# Patient Record
Sex: Male | Born: 1985 | Race: White | Hispanic: Yes | Marital: Married | Smoking: Former smoker
Health system: Southern US, Community
[De-identification: ages and names within clinical notes are randomized; demographics above are authoritative.]

## PROBLEM LIST (undated history)

## (undated) DIAGNOSIS — K219 Gastro-esophageal reflux disease without esophagitis: Secondary | ICD-10-CM

## (undated) DIAGNOSIS — K5792 Diverticulitis of intestine, part unspecified, without perforation or abscess without bleeding: Secondary | ICD-10-CM

## (undated) HISTORY — PX: NO PAST SURGERIES: SHX2092

---

## 2005-11-25 ENCOUNTER — Emergency Department (HOSPITAL_COMMUNITY): Admission: EM | Admit: 2005-11-25 | Discharge: 2005-11-25 | Payer: Self-pay | Admitting: Emergency Medicine

## 2015-06-18 ENCOUNTER — Encounter (HOSPITAL_COMMUNITY): Payer: Self-pay

## 2015-06-18 ENCOUNTER — Emergency Department (HOSPITAL_COMMUNITY): Payer: Self-pay

## 2015-06-18 DIAGNOSIS — R079 Chest pain, unspecified: Secondary | ICD-10-CM | POA: Insufficient documentation

## 2015-06-18 LAB — BASIC METABOLIC PANEL
ANION GAP: 9 (ref 5–15)
BUN: 19 mg/dL (ref 6–20)
CO2: 28 mmol/L (ref 22–32)
Calcium: 9 mg/dL (ref 8.9–10.3)
Chloride: 100 mmol/L — ABNORMAL LOW (ref 101–111)
Creatinine, Ser: 0.76 mg/dL (ref 0.61–1.24)
GFR calc Af Amer: >60 mL/min (ref >60)
GLUCOSE: 120 mg/dL — AB (ref 65–99)
POTASSIUM: 3.7 mmol/L (ref 3.5–5.1)
SODIUM: 137 mmol/L (ref 135–145)

## 2015-06-18 LAB — CBC
HCT: 45 % (ref 39.0–52.0)
Hemoglobin: 14.8 g/dL (ref 13.0–17.0)
MCH: 29.9 pg (ref 26.0–34.0)
MCHC: 32.9 g/dL (ref 30.0–36.0)
MCV: 90.9 fL (ref 78.0–100.0)
PLATELETS: 250 10*3/uL (ref 150–400)
RBC: 4.95 MIL/uL (ref 4.22–5.81)
RDW: 13.2 % (ref 11.5–15.5)
WBC: 7.7 10*3/uL (ref 4.0–10.5)

## 2015-06-18 LAB — I-STAT TROPONIN, ED: TROPONIN I, POC: 0 ng/mL (ref 0.00–0.08)

## 2015-06-18 NOTE — ED Notes (Signed)
Pt reports onset 4 hours PTA right sided chest pain.  Pt was painting at onset.  Painful; when taking deep breaths.  NAD at triage.

## 2015-06-19 ENCOUNTER — Emergency Department (HOSPITAL_COMMUNITY)
Admission: EM | Admit: 2015-06-19 | Discharge: 2015-06-19 | Disposition: A | Discharge status: Home / Self Care | Payer: Self-pay | Attending: Emergency Medicine | Admitting: Emergency Medicine

## 2015-06-19 DIAGNOSIS — R079 Chest pain, unspecified: Secondary | ICD-10-CM

## 2015-06-19 LAB — D-DIMER, QUANTITATIVE (NOT AT ARMC)

## 2015-06-19 MED ORDER — IBUPROFEN 600 MG PO TABS
600.0000 mg | ORAL_TABLET | Freq: Three times a day (TID) | ORAL | Status: AC | PRN
Start: 2015-06-19 — End: ?

## 2015-06-19 NOTE — ED Notes (Signed)
Dr Pickering at bedside 

## 2015-06-19 NOTE — ED Notes (Signed)
Patient verbalized understanding of discharge instructions and denies any further needs or questions at this time. VS stable. Patient ambulatory with steady gait.  

## 2015-06-19 NOTE — ED Provider Notes (Signed)
CSN: [id number]     Arrival date & time 06/18/15  2144 History  By signing my name below, I, Phillis Haggis, attest that this documentation has been prepared under the direction and in the presence of Benjiman Core, MD. Electronically Signed: Phillis Haggis, ED Scribe. 06/19/2015. 2:10 AM.   Chief Complaint  Patient presents with  . Chest Pain   The history is provided by the patient. No language interpreter was used.  HPI Comments: Andre Wong is a 30 y.o. male who presents to the Emergency Department complaining of sudden onset, right sided chest pain onset 4 hours ago. He states that the pain began while he was working at his house. He reports worsening pain with deep breathing. He denies hx of similar symptoms, hx of smoking, recent long travel, fever, chills, leg swelling, cough, or SOB.   History reviewed. No pertinent past medical history. History reviewed. No pertinent past surgical history. History reviewed. No pertinent family history. Social History  Substance Use Topics  . Smoking status: Never Smoker   . Smokeless tobacco: None  . Alcohol Use: 7.2 oz/week    12 Cans of beer per week    Review of Systems  Cardiovascular: Positive for chest pain.  All other systems reviewed and are negative.  Allergies  Review of patient's allergies indicates no known allergies.  Home Medications   Prior to Admission medications   Medication Sig Start Date End Date Taking? Authorizing Provider  ibuprofen (ADVIL,MOTRIN) 600 MG tablet Take 1 tablet (600 mg total) by mouth every 8 (eight) hours as needed. 06/19/15   Benjiman Core, MD   BP 118/75 mmHg  Pulse 59  Temp(Src) 98 F (36.7 C) (Oral)  Resp 18  Wt 191 lb 9 oz (86.892 kg)  SpO2 99% Physical Exam  Constitutional: He is oriented to person, place, and time. He appears well-developed and well-nourished.  HENT:  Head: Normocephalic and atraumatic.  Eyes: EOM are normal. Pupils are equal, round, and reactive to light.   Neck: Normal range of motion. Neck supple.  Cardiovascular: Normal rate, regular rhythm and normal heart sounds.  Exam reveals no gallop and no friction rub.   No murmur heard. Pulmonary/Chest: Effort normal and breath sounds normal. He has no wheezes. He exhibits no tenderness.  Abdominal: Soft. There is no tenderness.  Musculoskeletal: Normal range of motion. He exhibits no edema.  Neurological: He is alert and oriented to person, place, and time.  Skin: Skin is warm and dry.  Psychiatric: He has a normal mood and affect. His behavior is normal.  Nursing note and vitals reviewed.   ED Course  Procedures (including critical care time) DIAGNOSTIC STUDIES: Oxygen Saturation is 100% on RA, normal by my interpretation.    COORDINATION OF CARE: 2:10 AM-Discussed treatment plan which includes x-ray, labs and EKG with pt at bedside and pt agreed to plan.    Labs Review Labs Reviewed  BASIC METABOLIC PANEL - Abnormal; Notable for the following:    Chloride 100 (*)    Glucose, Bld 120 (*)    All other components within normal limits  CBC  D-DIMER, QUANTITATIVE (NOT AT [facility])  Rosezena Sensor, ED    Imaging Review Dg Chest 2 View  06/18/2015  CLINICAL DATA:  Right-sided chest pain, onset 4 hours ago. EXAM: CHEST  2 VIEW COMPARISON:  None. FINDINGS: The heart size and mediastinal contours are within normal limits. Both lungs are clear. The visualized skeletal structures are unremarkable. IMPRESSION: No active cardiopulmonary disease. Electronically  Signed   By: Burman NievesWilliam  Stevens M.D.   On: 06/18/2015 23:05   I have personally reviewed and evaluated these images and lab results as part of my medical decision-making.   EKG Interpretation   Date/Time:  Sunday June 18 2015 21:49:20 EST Ventricular Rate:  73 PR Interval:  160 QRS Duration: 90 QT Interval:  380 QTC Calculation: 418 R Axis:   50 Text Interpretation:  Normal sinus rhythm Normal ECG Confirmed by  Rubin PayorPICKERING  MD, Preet Perrier  [phone]([id number]) on 06/19/2015 2:23:51 AM      MDM   Final diagnoses:  Chest pain, unspecified chest pain type    Patient chest pain. Nontender on exam. EKG troponin and d-dimer x-ray reassuring. No abdominal tenderness. Will discharge home. I personally performed the services described in this documentation, which was scribed in my presence. The recorded information has been reviewed and is accurate.      Benjiman CoreNathan Reagyn Facemire, MD 06/19/15 [id number]

## 2015-06-19 NOTE — Discharge Instructions (Signed)
Dolor de pecho inespecfico  (Nonspecific Chest Pain) El dolor de pecho puede deberse a muchas enfermedades diferentes. Siempre existe una posibilidad de que el dolor est relacionado con algo grave, como un infarto de miocardio o un cogulo sanguneo en los pulmones. Hay muchas enfermedades que no son potencialmente mortales que pueden causar dolor de pecho. Si tiene dolor de pecho, es muy importante que se controle con el mdico. CAUSAS  Las causas del dolor de pecho pueden ser las siguientes:  Acidez estomacal.  Neumona o bronquitis.  Ansiedad o estrs.  Inflamacin de la zona que rodea al corazn (pericarditis) o a los pulmones (pleuritis o pleuresa).  Un cogulo sanguneo en el pulmn.  Colapso de un pulmn (neumotrax), que puede aparecer de manera repentina por s solo (neumotrax espontneo) o debido a un traumatismo en el trax.  Culebrilla (virus de la varicela zster).  Infarto de miocardio.  Dao de los huesos, los msculos y los cartlagos que conforman la pared torcica. Esto puede incluir lo siguiente:  Hematomas seos debido a lesiones.  Distensiones musculares o de los cartlagos por tos frecuente o repetida, o por exceso de trabajo.  Fractura de una o ms costillas.  Dolor de cartlago debido a inflamacin (costocondritis). FACTORES DE RIESGO  Los factores de riesgo de tener dolor de pecho pueden incluir lo siguiente:  Actividades que incrementan el riesgo de sufrir traumatismos o lesiones en el trax.  Infecciones o enfermedades respiratorias que causan tos frecuente.  Enfermedades o excesos en las comidas que pueden causar acidez.  Enfermedades cardacas o antecedentes familiares de enfermedades cardacas.  Enfermedades o comportamientos de salud que aumentan el riesgo de tener un cogulo sanguneo.  Haber tenido varicela (varicela zster). SIGNOS Y SNTOMAS El dolor de pecho puede provocar las siguientes sensaciones:  Ardor u hormigueo en la  superficie o en lo profundo del pecho.  Dolor opresivo, continuo o constrictivo.  Dolor vago o intenso que empeora al moverse, toser o inhalar profundamente.  Dolor que tambin se siente en la espalda, el cuello, el hombro o el brazo, o dolor que se irradia a cualquiera de estas zonas. El dolor de pecho puede aparecer y desaparecer, o bien puede ser constante. DIAGNSTICO Quizs se necesiten anlisis de laboratorio u otros estudios para encontrar la causa del dolor. El mdico puede indicarle que se haga una prueba llamada EGC (electrocadiograma) ambulatorio. El electrocardiograma registra los patrones de los latidos cardacos en el momento en que se realiza el estudio. Tambin pueden hacerle otros estudios, por ejemplo:  Ecocardiograma transtorcico (ETT). Durante el ecocardiograma, se usan ondas sonoras para crear una imagen de todas las estructuras cardacas y evaluar cmo circula la sangre por el corazn.  Ecocardiograma transesofgico (ETE).Este es un estudio de diagnstico por imgenes ms avanzado que el obtiene imgenes del interior del cuerpo. Le permite al mdico ver el corazn con mayor detalle.  Monitoreo cardaco. Permite que el mdico controle la frecuencia y el ritmo cardaco en tiempo real.  Monitor Holter. Es un dispositivo porttil que registra los latidos del corazn y puede ayudar a diagnosticar las arritmias cardacas. Le permite al mdico registrar la actividad cardaca durante varios das, si es necesario.  Pruebas de esfuerzo. Estas pueden realizarse durante el ejercicio o mediante la administracin de un medicamento que acelera los latidos del corazn.  Anlisis de sangre.  Diagnstico por imgenes. TRATAMIENTO  El tratamiento depende de la causa del dolor de pecho. El tratamiento puede incluir lo siguiente:  Medicamentos. Estos pueden incluir lo siguiente:    Inhibidores de la acidez estomacal.  Antiinflamatorios.  Analgsicos para las enfermedades  inflamatorias.  Antibiticos, si hay una infeccin.  Medicamentos para disolver los cogulos sanguneos.  Medicamentos para tratar la enfermedad arterial coronaria.  Tratamiento complementario para las enfermedades que no requieren la toma de medicamentos. Esto puede incluir lo siguiente:  Descansar.  Aplicar compresas fras o calientes en las zonas lesionadas.  Limitar las actividades hasta que disminuya el dolor. INSTRUCCIONES PARA EL CUIDADO EN EL HOGAR  Si le recetaron antibiticos, asegrese de terminarlos, incluso si comienza a sentirse mejor.  Evite las actividades que le causen dolor de pecho.  No consuma ningn producto que contenga tabaco, lo que incluye cigarrillos, tabaco de mascar o cigarrillos electrnicos. Si necesita ayuda para dejar de fumar, consulte al mdico.  No beba alcohol.  Tome los medicamentos solamente como se lo haya indicado el mdico.  Concurra a todas las visitas de control como se lo haya indicado el mdico. Esto es importante. Esto incluye otros estudios si el dolor de pecho no desaparece.  Si la acidez es la causa del dolor de pecho, tal vez le aconsejen que mantenga la cabeza levantada (elevada) mientras duerme. Esto reduce la probabilidad de que el cido retroceda del estmago al esfago.  Haga cambios en su estilo de vida como se lo haya indicado el mdico. Estos pueden incluir lo siguiente:  Practicar actividad fsica con regularidad. Pida al mdico que le sugiera algunas actividades que sean seguras para usted.  Consumir una dieta cardiosaludable. Un nutricionista matriculado puede ayudarlo a hacer elecciones saludables.  Mantener un peso saludable.  Controlar la diabetes, si es necesario.  Reducir las situaciones de estrs. SOLICITE ATENCIN MDICA SI:  El dolor de pecho no desaparece despus del tratamiento.  Tiene una erupcin cutnea con ampollas en el pecho.  Tiene fiebre. SOLICITE ATENCIN MDICA DE INMEDIATO SI:   El  dolor en el pecho es ms intenso.  La tos empeora, o expectora sangre.  Siente un dolor abdominal intenso.  Siente debilidad intensa.  Se desmaya.  Tiene escalofros.  Tiene una molestia repentina e inexplicable en el pecho.  Tiene molestias repentinas e inexplicables en los brazos, la espalda, el cuello o la mandbula.  Le falta el aire en cualquier momento.  Comienza a sudar de manera repentina o la piel se le humedece.  Siente nuseas o vomita.  Se siente repentinamente mareado o se desmaya.  Siente que el corazn comienza a latir rpidamente o que se saltea latidos. Estos sntomas pueden representar un problema grave que constituye una emergencia. No espere hasta que los sntomas desaparezcan. Solicite atencin mdica de inmediato. Comunquese con el servicio de emergencias de su localidad (911 en los [place]). No conduzca por sus propios medios hasta el hospital.   Esta informacin no tiene como fin reemplazar el consejo del mdico. Asegrese de hacerle al mdico cualquier pregunta que tenga.   Document Released: 04/01/2005 Document Revised: 04/22/2014 Elsevier Interactive Patient Education 2016 Elsevier Inc.  

## 2016-02-28 ENCOUNTER — Encounter (HOSPITAL_COMMUNITY): Payer: Self-pay | Admitting: *Deleted

## 2016-02-28 ENCOUNTER — Inpatient Hospital Stay (HOSPITAL_COMMUNITY)
Admission: EM | Admit: 2016-02-28 | Discharge: 2016-03-01 | DRG: 872 | Disposition: A | Discharge status: Home / Self Care | Payer: Self-pay | Attending: Internal Medicine | Admitting: Internal Medicine

## 2016-02-28 ENCOUNTER — Emergency Department (HOSPITAL_COMMUNITY): Payer: Self-pay

## 2016-02-28 DIAGNOSIS — D72829 Elevated white blood cell count, unspecified: Secondary | ICD-10-CM

## 2016-02-28 DIAGNOSIS — K5792 Diverticulitis of intestine, part unspecified, without perforation or abscess without bleeding: Secondary | ICD-10-CM

## 2016-02-28 DIAGNOSIS — Z79899 Other long term (current) drug therapy: Secondary | ICD-10-CM

## 2016-02-28 DIAGNOSIS — R109 Unspecified abdominal pain: Secondary | ICD-10-CM

## 2016-02-28 DIAGNOSIS — A419 Sepsis, unspecified organism: Principal | ICD-10-CM | POA: Diagnosis present

## 2016-02-28 DIAGNOSIS — Z23 Encounter for immunization: Secondary | ICD-10-CM

## 2016-02-28 DIAGNOSIS — K5732 Diverticulitis of large intestine without perforation or abscess without bleeding: Secondary | ICD-10-CM | POA: Diagnosis present

## 2016-02-28 HISTORY — DX: Diverticulitis of intestine, part unspecified, without perforation or abscess without bleeding: K57.92

## 2016-02-28 HISTORY — DX: Gastro-esophageal reflux disease without esophagitis: K21.9

## 2016-02-28 LAB — URINALYSIS, ROUTINE W REFLEX MICROSCOPIC
BILIRUBIN URINE: NEGATIVE
Glucose, UA: NEGATIVE mg/dL
Hgb urine dipstick: NEGATIVE
KETONES UR: NEGATIVE mg/dL
LEUKOCYTES UA: NEGATIVE
NITRITE: NEGATIVE
PH: 6.5 (ref 5.0–8.0)
PROTEIN: NEGATIVE mg/dL
Specific Gravity, Urine: 1.01 (ref 1.005–1.030)

## 2016-02-28 LAB — COMPREHENSIVE METABOLIC PANEL
ALBUMIN: 3.8 g/dL (ref 3.5–5.0)
ALK PHOS: 50 U/L (ref 38–126)
ALT: 26 U/L (ref 17–63)
AST: 18 U/L (ref 15–41)
Anion gap: 9 (ref 5–15)
BILIRUBIN TOTAL: 1.1 mg/dL (ref 0.3–1.2)
BUN: 16 mg/dL (ref 6–20)
CALCIUM: 9 mg/dL (ref 8.9–10.3)
CO2: 26 mmol/L (ref 22–32)
CREATININE: 0.9 mg/dL (ref 0.61–1.24)
Chloride: 104 mmol/L (ref 101–111)
GFR calc Af Amer: >60 mL/min (ref >60)
GLUCOSE: 99 mg/dL (ref 65–99)
POTASSIUM: 3.9 mmol/L (ref 3.5–5.1)
Sodium: 139 mmol/L (ref 135–145)
TOTAL PROTEIN: 6.5 g/dL (ref 6.5–8.1)

## 2016-02-28 LAB — CBC
HCT: 45.2 % (ref 39.0–52.0)
Hemoglobin: 15.1 g/dL (ref 13.0–17.0)
MCH: 30.3 pg (ref 26.0–34.0)
MCHC: 33.4 g/dL (ref 30.0–36.0)
MCV: 90.8 fL (ref 78.0–100.0)
PLATELETS: 193 10*3/uL (ref 150–400)
RBC: 4.98 MIL/uL (ref 4.22–5.81)
RDW: 13.2 % (ref 11.5–15.5)
WBC: 13.4 10*3/uL — AB (ref 4.0–10.5)

## 2016-02-28 LAB — LIPASE, BLOOD: Lipase: 24 U/L (ref 11–51)

## 2016-02-28 LAB — I-STAT CG4 LACTIC ACID, ED: Lactic Acid, Venous: 0.89 mmol/L (ref 0.5–1.9)

## 2016-02-28 MED ORDER — ENOXAPARIN SODIUM 40 MG/0.4ML ~~LOC~~ SOLN
40.0000 mg | SUBCUTANEOUS | Status: DC
  Administered 2016-02-28 – 2016-02-29 (×2): 40 mg via SUBCUTANEOUS
  Filled 2016-02-28 (×2): qty 0.4

## 2016-02-28 MED ORDER — MORPHINE SULFATE (PF) 4 MG/ML IV SOLN
4.0000 mg | Freq: Once | INTRAVENOUS | Status: AC
  Administered 2016-02-28: 4 mg via INTRAVENOUS
  Filled 2016-02-28: qty 1

## 2016-02-28 MED ORDER — INFLUENZA VAC SPLIT QUAD 0.5 ML IM SUSY
0.5000 mL | PREFILLED_SYRINGE | INTRAMUSCULAR | Status: AC
  Administered 2016-03-01: 0.5 mL via INTRAMUSCULAR
  Filled 2016-02-28 (×2): qty 0.5

## 2016-02-28 MED ORDER — SODIUM CHLORIDE 0.9 % IV BOLUS (SEPSIS)
1000.0000 mL | Freq: Once | INTRAVENOUS | Status: AC
  Administered 2016-02-28: 1000 mL via INTRAVENOUS

## 2016-02-28 MED ORDER — SODIUM CHLORIDE 0.45 % IV SOLN
INTRAVENOUS | Status: DC
  Administered 2016-02-28: 1000 mL via INTRAVENOUS
  Administered 2016-02-29: 11:00:00 via INTRAVENOUS
  Administered 2016-03-01: 1000 mL via INTRAVENOUS

## 2016-02-28 MED ORDER — DEXTROSE 5 % IV SOLN
2.0000 g | INTRAVENOUS | Status: DC
  Filled 2016-02-28: qty 2

## 2016-02-28 MED ORDER — KETOROLAC TROMETHAMINE 30 MG/ML IJ SOLN: 30.0000 mg | Freq: Four times a day (QID) | INTRAMUSCULAR | Status: DC | PRN

## 2016-02-28 MED ORDER — ACETAMINOPHEN 325 MG PO TABS
650.0000 mg | ORAL_TABLET | Freq: Four times a day (QID) | ORAL | Status: DC | PRN
  Administered 2016-02-28 – 2016-02-29 (×3): 650 mg via ORAL
  Filled 2016-02-28 (×3): qty 2

## 2016-02-28 MED ORDER — SODIUM CHLORIDE 0.9 % IV BOLUS (SEPSIS)
1000.0000 mL | Freq: Once | INTRAVENOUS | Status: AC
Start: 2016-02-28 — End: 2016-02-28
  Administered 2016-02-28: 1000 mL via INTRAVENOUS

## 2016-02-28 MED ORDER — ONDANSETRON HCL 4 MG/2ML IJ SOLN: 4.0000 mg | Freq: Four times a day (QID) | INTRAMUSCULAR | Status: DC | PRN

## 2016-02-28 MED ORDER — HYDROMORPHONE HCL 2 MG/ML IJ SOLN
1.0000 mg | Freq: Once | INTRAMUSCULAR | Status: AC
  Administered 2016-02-28: 1 mg via INTRAVENOUS
  Filled 2016-02-28: qty 1

## 2016-02-28 MED ORDER — ONDANSETRON HCL 4 MG PO TABS: 4.0000 mg | ORAL_TABLET | Freq: Four times a day (QID) | ORAL | Status: DC | PRN

## 2016-02-28 MED ORDER — ONDANSETRON HCL 4 MG/2ML IJ SOLN
4.0000 mg | Freq: Once | INTRAMUSCULAR | Status: AC
  Administered 2016-02-28: 4 mg via INTRAVENOUS
  Filled 2016-02-28: qty 2

## 2016-02-28 MED ORDER — METRONIDAZOLE IN NACL 5-0.79 MG/ML-% IV SOLN
500.0000 mg | Freq: Once | INTRAVENOUS | Status: AC
  Administered 2016-02-28: 500 mg via INTRAVENOUS
  Filled 2016-02-28: qty 100

## 2016-02-28 MED ORDER — IOPAMIDOL (ISOVUE-300) INJECTION 61%
INTRAVENOUS | Status: AC
  Administered 2016-02-28: 100 mL
  Filled 2016-02-28: qty 100

## 2016-02-28 MED ORDER — DEXTROSE 5 % IV SOLN
2.0000 g | Freq: Once | INTRAVENOUS | Status: AC
  Administered 2016-02-28: 2 g via INTRAVENOUS
  Filled 2016-02-28: qty 2

## 2016-02-28 MED ORDER — FENTANYL CITRATE (PF) 100 MCG/2ML IJ SOLN
25.0000 ug | INTRAMUSCULAR | Status: DC | PRN
  Administered 2016-02-28 – 2016-02-29 (×2): 50 ug via INTRAVENOUS
  Filled 2016-02-28 (×3): qty 2

## 2016-02-28 MED ORDER — METRONIDAZOLE 50 MG/ML ORAL SUSPENSION
500.0000 mg | Freq: Three times a day (TID) | ORAL | Status: DC
  Administered 2016-02-28 – 2016-03-01 (×5): 500 mg via ORAL
  Filled 2016-02-28 (×8): qty 10

## 2016-02-28 MED ORDER — ACETAMINOPHEN 650 MG RE SUPP: 650.0000 mg | Freq: Four times a day (QID) | RECTAL | Status: DC | PRN

## 2016-02-28 NOTE — ED Notes (Signed)
Pt ambulated to restroom. 

## 2016-02-28 NOTE — Progress Notes (Signed)
Patient arrived to the floor at 20:00. I gave him hospital book and pamphets re: advance directives. I also printed out an information page on diverticulitis.

## 2016-02-28 NOTE — Progress Notes (Signed)
Pharmacy Antibiotic Note  Andre DurandHector M Wong is a 30 y.o. male admitted on 02/28/2016 with intra-abdominal infection. Pharmacy has been consulted for ceftriaxone dosing.  Day #1 of abx for intra-abdominal infection.Given one time dose of ceftriaxone in the ED. Afebrile, WBC 13.4.  Plan: Start ceftriaxone 2g IV Q24 Continue Flagyl 500mg  PO TID per MD Monitor clinical picture F/U C&S, abx deescalation / LOT     Temp (24hrs), Avg:98.3 F (36.8 C), Min:98.3 F (36.8 C), Max:98.3 F (36.8 C)   Recent Labs Lab 02/28/16 0851 02/28/16 1151  WBC 13.4*  --   CREATININE 0.90  --   LATICACIDVEN  --  0.89    CrCl cannot be calculated (Unknown ideal weight.).    No Known Allergies  Antimicrobials this admission: Ceftriaxone 11/15 >>  Metronidazole 11/15 >>   Dose adjustments this admission: n/a  Microbiology results: 11/15 BCx: sent 11/15 UCx: sent   Thank you for allowing pharmacy to be a part of this patient's care.  Armandina StammerBATCHELDER,[place]ub[place] Hert J 02/28/2016 5:34 PM

## 2016-02-28 NOTE — ED Triage Notes (Signed)
Pt has mid lower abd pain since Sunday. Unable to sleep due to pain. Has mild pain with urination. Has nausea. Denies blood in urine, vomiting or diarrhea.

## 2016-02-28 NOTE — Progress Notes (Signed)
Called for report. Spoke to [employer], Charity fundraiserN. Patient being admitted to room 7 with confirmed diverticulitis. RN states he has received 2 Lt ns,rocephine and flagyl. 1st language is Spanish, does not speak [place], per Charity fundraiserN. Awaiting arrival.

## 2016-02-28 NOTE — H&P (Signed)
History and Physical    Andre DurandHector M Wong ZOX:[id number] DOB: Feb 05, 1986 DOA: 02/28/2016  PCP: No primary care provider on file. Patient coming from: home  Chief Complaint: abdominal pain  HPI: Andre DurandHector M Wong is a 30 y.o. male with no known medical conditions. Abdominal pain started 2-3 days ago. Left lower quadrant. Initially very mild and intermittent but now constant and getting worse. Poor oral intake over the last day due to pain. Associated with some nausea but denies any hematochezia, melena, hematemesis, emesis, chest pain, shortness of breath, palpitations, dysuria, frequency, neck stiffness. Patient does endorse onset of subjective fevers and chills overnight. Patient sent to the hospital from work after his boss noticed how ill-appearing few left.   ED Course: Objective findings outlined below. Started on IV pain medications and ceftriaxone and Flagyl. Pain is very difficult to control. Patient given 3 L normal saline bolus  Review of Systems: As per HPI otherwise 10 point review of systems negative.   Ambulatory Status: no restrictions  Past Medical History:  Diagnosis Date  . Medical history non-contributory     Past Surgical History:  Procedure Laterality Date  . NO PAST SURGERIES      Social History   Social History  . Marital status: Married    Spouse name: N/A  . Number of children: N/A  . Years of education: N/A   Occupational History  . Not on file.   Social History Main Topics  . Smoking status: Never Smoker  . Smokeless tobacco: Not on file  . Alcohol use 7.2 oz/week    12 Cans of beer per week  . Drug use: No  . Sexual activity: Not on file   Other Topics Concern  . Not on file   Social History Narrative  . No narrative on file    No Known Allergies  Family History  Problem Relation Age of Onset  . Diabetes Mother     Prior to Admission medications   Medication Sig Start Date End Date Taking? Authorizing Provider  ibuprofen  (ADVIL,MOTRIN) 600 MG tablet Take 1 tablet (600 mg total) by mouth every 8 (eight) hours as needed. Patient taking differently: Take 600 mg by mouth every 8 (eight) hours as needed for moderate pain.  06/19/15  Yes Benjiman CoreNathan Pickering, MD  indomethacin (INDOCIN) 25 MG capsule Take 25 mg by mouth 3 (three) times daily. 02/26/16  Yes Historical Provider, MD    Physical Exam: Vitals:   02/28/16 1530 02/28/16 1545 02/28/16 1600 02/28/16 1630  BP: 116/72 124/71 128/77 120/73  Pulse: 93 89 91 78  Resp: 17     Temp:      TempSrc:      SpO2: 100% 100% 99% 99%     General:  Appears calm and comfortable Eyes:  PERRL, EOMI, normal lids, iris ENT:  grossly normal hearing, lips & tongue, mmm Neck:  no LAD, masses or thyromegaly Cardiovascular:  RRR, no m/r/g. No LE edema.  Respiratory:  CTA bilaterally, no w/r/r. Normal respiratory effort. Abdomen:  soft, ntnd, NABS Skin:  no rash or induration seen on limited exam Musculoskeletal:  grossly normal tone BUE/BLE, good ROM, no bony abnormality Psychiatric:  grossly normal mood and affect, speech fluent and appropriate, AOx3 Neurologic:  CN 2-12 grossly intact, moves all extremities in coordinated fashion, sensation intact  Labs on Admission: I have personally reviewed following labs and imaging studies  CBC:  Recent Labs Lab 02/28/16 0851  WBC 13.4*  HGB 15.1  HCT 45.2  MCV 90.8  PLT 193   Basic Metabolic Panel:  Recent Labs Lab 02/28/16 0851  NA 139  K 3.9  CL 104  CO2 26  GLUCOSE 99  BUN 16  CREATININE 0.90  CALCIUM 9.0   GFR: CrCl cannot be calculated (Unknown ideal weight.). Liver Function Tests:  Recent Labs Lab 02/28/16 0851  AST 18  ALT 26  ALKPHOS 50  BILITOT 1.1  PROT 6.5  ALBUMIN 3.8    Recent Labs Lab 02/28/16 0851  LIPASE 24   No results for input(s): AMMONIA in the last 168 hours. Coagulation Profile: No results for input(s): INR, PROTIME in the last 168 hours. Cardiac Enzymes: No results for  input(s): CKTOTAL, CKMB, CKMBINDEX, TROPONINI in the last 168 hours. BNP (last 3 results) No results for input(s): PROBNP in the last 8760 hours. HbA1C: No results for input(s): HGBA1C in the last 72 hours. CBG: No results for input(s): GLUCAP in the last 168 hours. Lipid Profile: No results for input(s): CHOL, HDL, LDLCALC, TRIG, CHOLHDL, LDLDIRECT in the last 72 hours. Thyroid Function Tests: No results for input(s): TSH, T4TOTAL, FREET4, T3FREE, THYROIDAB in the last 72 hours. Anemia Panel: No results for input(s): VITAMINB12, FOLATE, FERRITIN, TIBC, IRON, RETICCTPCT in the last 72 hours. Urine analysis:    Component Value Date/Time   COLORURINE YELLOW 02/28/2016 0856   APPEARANCEUR CLEAR 02/28/2016 0856   LABSPEC 1.010 02/28/2016 0856   PHURINE 6.5 02/28/2016 0856   GLUCOSEU NEGATIVE 02/28/2016 0856   HGBUR NEGATIVE 02/28/2016 0856   BILIRUBINUR NEGATIVE 02/28/2016 0856   KETONESUR NEGATIVE 02/28/2016 0856   PROTEINUR NEGATIVE 02/28/2016 0856   NITRITE NEGATIVE 02/28/2016 0856   LEUKOCYTESUR NEGATIVE 02/28/2016 0856    Creatinine Clearance: CrCl cannot be calculated (Unknown ideal weight.).  Sepsis Labs: @LABRCNTIP (procalcitonin:4,lacticidven:4) )No results found for this or any previous visit (from the past 240 hour(s)).   Radiological Exams on Admission: Ct Abdomen Pelvis W Contrast  Result Date: 02/28/2016 CLINICAL DATA:  Lower abdominal pain for 4 days.  Nausea.  Dysuria. EXAM: CT ABDOMEN AND PELVIS WITH CONTRAST TECHNIQUE: Multidetector CT imaging of the abdomen and pelvis was performed using the standard protocol following bolus administration of intravenous contrast. CONTRAST:  100mL ISOVUE-300 IOPAMIDOL (ISOVUE-300) INJECTION 61% COMPARISON:  None. FINDINGS: Lower Chest: No acute findings. Hepatobiliary: No masses identified. Mild hepatic steatosis. Gallbladder is unremarkable. Pancreas:  No mass or inflammatory changes. Spleen: Within normal limits in size and  appearance. Adrenals/Urinary Tract: No masses identified. No evidence of hydronephrosis. Stomach/Bowel: Mild to moderate diverticulitis is seen involving the proximal sigmoid colon. No evidence of abscess or extraluminal gas. Normal appendix visualized. No evidence of bowel obstruction. Vascular/Lymphatic: No pathologically enlarged lymph nodes. No abdominal aortic aneurysm. Reproductive:  No mass identified. Other:  None. Musculoskeletal:  No suspicious bone lesions identified. IMPRESSION: Mild to moderate diverticulitis involving proximal sigmoid colon. No evidence of abscess or other complication. Mild hepatic steatosis. Electronically Signed   By: Myles RosenthalJohn  Stahl M.D.   On: 02/28/2016 12:27    EKG: Independently reviewed.   Assessment/Plan Active Problems:   Diverticulitis   Intractable abdominal pain   Leukocytosis    Diverticulitis: Fairly benign previous past medical history. Confirmed on CT. No evidence of abscess or perforation. - IVF, ceftriaxone, Flagyl - Outpatient evaluation by GI  Intractable pain: Difficult to control multiple rounds of Dilaudid and Zofran in the ED. Anticipate this may take some time to improve - Toradol, fentanyl, Tylenol, Zofran  Leukocytosis: Likely secondary to diverticulitis. No evidence of  URI or pulmonary or urinary tract infection. - BCX  DVT prophylaxis:  lovenox  Code Status: FULL  Family Communication: none  Disposition Plan: admit for pain control  Consults called: none - needs outpt GI Admission status: Obs    Khamron Gellert J MD [facility]  If 7PM-7AM, please contact night-coverage [email or web address] Password [email or web address]  02/28/2016, 5:27 PM

## 2016-02-28 NOTE — ED Notes (Signed)
Patient transported to CT 

## 2016-02-28 NOTE — ED Provider Notes (Signed)
[facility]-EMERGENCY DEPT Provider Note   CSN: [id number] Arrival date & time: 02/28/16  [id number]     History   Chief Complaint Chief Complaint  Patient presents with  . Abdominal Pain    HPI Glenna DurandHector M Bautista is a 30 y.o. male With no significant past medical history who presents with four days of severe abdominal pain.Patient reports this pain has continued to worsen and associated with nausea, chills, and inability to sleep. Patient described the pain as 10 out of 10, nonradiating, sharp and twisting in quality, and not improved by anything. Patient reports that palpation in certain positions make it worse. He denies constipation, diarrhea, and had a normal bowel yesterday. He denies any change in urination. He has not had any relief with over-the-counter pain medicine. He denies any abdominal trauma. He denies any prior abdominal surgeries.    The history is provided by the patient and medical records.  Abdominal Pain   This is a new problem. The current episode started more than 2 days ago (4 days). The problem occurs constantly. The problem has been gradually worsening. The pain is located in the LLQ and suprapubic region. The pain is at a severity of 10/10. The pain is severe. Associated symptoms include nausea. Pertinent negatives include fever, diarrhea, vomiting, constipation, dysuria and headaches. The symptoms are aggravated by palpation and certain positions. Nothing relieves the symptoms.    History reviewed. No pertinent past medical history.  There are no active problems to display for this patient.   History reviewed. No pertinent surgical history.     Home Medications    Prior to Admission medications   Medication Sig Start Date End Date Taking? Authorizing Provider  ibuprofen (ADVIL,MOTRIN) 600 MG tablet Take 1 tablet (600 mg total) by mouth every 8 (eight) hours as needed. Patient taking differently: Take 600 mg by mouth every 8 (eight) hours as needed for moderate  pain.  06/19/15  Yes Benjiman CoreNathan Pickering, MD  indomethacin (INDOCIN) 25 MG capsule Take 25 mg by mouth 3 (three) times daily. 02/26/16  Yes Historical Provider, MD    Family History History reviewed. No pertinent family history.  Social History Social History  Substance Use Topics  . Smoking status: Never Smoker  . Smokeless tobacco: Not on file  . Alcohol use 7.2 oz/week    12 Cans of beer per week     Allergies   Patient has no known allergies.   Review of Systems Review of Systems  Constitutional: Positive for chills. Negative for activity change, diaphoresis, fatigue and fever.  HENT: Negative for congestion and rhinorrhea.   Eyes: Negative for visual disturbance.  Respiratory: Negative for cough, chest tightness, shortness of breath, wheezing and stridor.   Cardiovascular: Negative for chest pain, palpitations and leg swelling.  Gastrointestinal: Positive for abdominal pain and nausea. Negative for abdominal distention, anal bleeding, blood in stool, constipation, diarrhea and vomiting.  Genitourinary: Negative for difficulty urinating, dysuria, flank pain and testicular pain.  Musculoskeletal: Negative for back pain and gait problem.  Skin: Negative for rash and wound.  Neurological: Negative for dizziness, weakness, light-headedness and headaches.  Psychiatric/Behavioral: Negative for agitation.  All other systems reviewed and are negative.    Physical Exam Updated Vital Signs BP 121/73 (BP Location: Right Arm)   Pulse 72   Temp 98.3 F (36.8 C) (Oral)   Resp 18   SpO2 99%   Physical Exam  Constitutional: He appears well-developed and well-nourished.  HENT:  Head: Normocephalic and atraumatic.  Eyes:  Conjunctivae are normal.  Neck: Neck supple.  Cardiovascular: Normal rate and regular rhythm.   No murmur heard. Pulmonary/Chest: Effort normal and breath sounds normal. No respiratory distress. He has no wheezes. He has no rales. He exhibits no tenderness.    Abdominal: Soft. Normal appearance. There is tenderness in the suprapubic area and left lower quadrant. There is no rigidity and no CVA tenderness. Hernia confirmed negative in the right inguinal area and confirmed negative in the left inguinal area.    Genitourinary: Penis normal. Right testis shows no mass, no swelling and no tenderness. Cremasteric reflex is not absent on the right side. Left testis shows no mass, no swelling and no tenderness. Cremasteric reflex is not absent on the left side. No penile tenderness.  Musculoskeletal: He exhibits no edema or tenderness.  Neurological: He is alert.  Skin: Skin is warm and dry.  Psychiatric: He has a normal mood and affect.  Nursing note and vitals reviewed.    ED Treatments / Results  Labs (all labs ordered are listed, but only abnormal results are displayed) Labs Reviewed  CBC - Abnormal; Notable for the following:       Result Value   WBC 13.4 (*)    All other components within normal limits  URINE CULTURE  CULTURE, BLOOD (ROUTINE X 2)  CULTURE, BLOOD (ROUTINE X 2)  LIPASE, BLOOD  COMPREHENSIVE METABOLIC PANEL  URINALYSIS, ROUTINE W REFLEX MICROSCOPIC (NOT AT [facility])  CBC  BASIC METABOLIC PANEL  I-STAT CG4 LACTIC ACID, ED    EKG  EKG Interpretation None       Radiology Ct Abdomen Pelvis W Contrast  Result Date: 02/28/2016 CLINICAL DATA:  Lower abdominal pain for 4 days.  Nausea.  Dysuria. EXAM: CT ABDOMEN AND PELVIS WITH CONTRAST TECHNIQUE: Multidetector CT imaging of the abdomen and pelvis was performed using the standard protocol following bolus administration of intravenous contrast. CONTRAST:  ISOVUE-300 IOPAMIDOL (ISOVUE-300) INJECTION 61% COMPARISON:  None. FINDINGS: Lower Chest: No acute findings. Hepatobiliary: No masses identified. Mild hepatic steatosis. Gallbladder is unremarkable. Pancreas:  No mass or inflammatory changes. Spleen: Within normal limits in size and appearance. Adrenals/Urinary Tract: No  masses identified. No evidence of hydronephrosis. Stomach/Bowel: Mild to moderate diverticulitis is seen involving the proximal sigmoid colon. No evidence of abscess or extraluminal gas. Normal appendix visualized. No evidence of bowel obstruction. Vascular/Lymphatic: No pathologically enlarged lymph nodes. No abdominal aortic aneurysm. Reproductive:  No mass identified. Other:  None. Musculoskeletal:  No suspicious bone lesions identified. IMPRESSION: Mild to moderate diverticulitis involving proximal sigmoid colon. No evidence of abscess or other complication. Mild hepatic steatosis. Electronically Signed   By: Myles Rosenthal M.D.   On: 02/28/2016 12:27    Procedures Procedures (including critical care time)  Medications Ordered in ED Medications  enoxaparin (LOVENOX) injection 40 mg (not administered)  0.45 % sodium chloride infusion (not administered)  acetaminophen (TYLENOL) tablet 650 mg (650 mg Oral Given 02/28/16 2029)    Or  acetaminophen (TYLENOL) suppository 650 mg ( Rectal See Alternative 02/28/16 2029)  ketorolac (TORADOL) 30 MG/ML injection 30 mg (not administered)  ondansetron (ZOFRAN) tablet 4 mg (not administered)    Or  ondansetron (ZOFRAN) injection 4 mg (not administered)  metroNIDAZOLE (FLAGYL) 50 mg/ml oral suspension 500 mg (500 mg Oral Given 02/28/16 2156)  fentaNYL (SUBLIMAZE) injection 25-50 mcg (50 mcg Intravenous Given 02/28/16 1955)  cefTRIAXone (ROCEPHIN) 2 g in dextrose 5 % 50 mL IVPB (not administered)  Influenza vac split quadrivalent PF (FLUARIX) injection  0.5 mL (not administered)  sodium chloride 0.9 % bolus 1,000 mL (0 mLs Intravenous Stopped 02/28/16 1321)  morphine 4 MG/ML injection 4 mg (4 mg Intravenous Given 02/28/16 1129)  ondansetron (ZOFRAN) injection 4 mg (4 mg Intravenous Given 02/28/16 1129)  iopamidol (ISOVUE-300) 61 % injection (100 mLs  Contrast Given 02/28/16 1200)  HYDROmorphone (DILAUDID) injection 1 mg (1 mg Intravenous Given 02/28/16 1334)   sodium chloride 0.9 % bolus 1,000 mL (0 mLs Intravenous Stopped 02/28/16 1510)  HYDROmorphone (DILAUDID) injection 1 mg (1 mg Intravenous Given 02/28/16 1531)  sodium chloride 0.9 % bolus 1,000 mL (0 mLs Intravenous Stopped 02/28/16 1728)  cefTRIAXone (ROCEPHIN) 2 g in dextrose 5 % 50 mL IVPB (0 g Intravenous Stopped 02/28/16 1728)    And  metroNIDAZOLE (FLAGYL) IVPB 500 mg (0 mg Intravenous Stopped 02/28/16 1728)     Initial Impression / Assessment and Plan / ED Course  I have reviewed the triage vital signs and the nursing notes.  Pertinent labs & imaging results that were available during my care of the patient were reviewed by me and considered in my medical decision making (see chart for details).  Clinical Course     Glenna DurandHector M Bautista is a 30 y.o. male With no significant past medical history who presents with four days of severe abdominal pain.  Exam revealed significant left lower quadrant and suprapubic tenderness. Normal GU exam. No CVA tenderness. Exam otherwise unremarkable.  Given location and description of symptoms, suspect diverticulitis. Laboratory testing and CT scan ordered.  Patient found to have leukocytosis. No anemia, normal lipase, Unremarkable urine, and normal lactic acid.  CT scan showed diverticulitis. No evidence of abscess or perforation.  Patient required multiple doses of IV pain medicine. Patient was unable to get pain under control. Patient started on antibiotics.  Due to inability to control pain and continued nausea, patient admitted to hospitalist service for further management of diverticulitis.  Final Clinical Impressions(s) / ED Diagnoses   Final diagnoses:  Diverticulitis of large intestine without perforation or abscess without bleeding   Clinical Impression: 1. Diverticulitis of large intestine without perforation or abscess without bleeding     Disposition: Admit to Hospitalist service    Heide Scaleshristopher J Aundra Pung, MD 02/28/16  2206

## 2016-02-29 DIAGNOSIS — A419 Sepsis, unspecified organism: Principal | ICD-10-CM

## 2016-02-29 LAB — CBC
HCT: 42.7 % (ref 39.0–52.0)
HEMOGLOBIN: 14.2 g/dL (ref 13.0–17.0)
MCH: 30.4 pg (ref 26.0–34.0)
MCHC: 33.3 g/dL (ref 30.0–36.0)
MCV: 91.4 fL (ref 78.0–100.0)
Platelets: 212 10*3/uL (ref 150–400)
RBC: 4.67 MIL/uL (ref 4.22–5.81)
RDW: 13.6 % (ref 11.5–15.5)
WBC: 14.6 10*3/uL — ABNORMAL HIGH (ref 4.0–10.5)

## 2016-02-29 LAB — URINE CULTURE: Culture: NO GROWTH

## 2016-02-29 LAB — BASIC METABOLIC PANEL
Anion gap: 10 (ref 5–15)
BUN: 8 mg/dL (ref 6–20)
CHLORIDE: 101 mmol/L (ref 101–111)
CO2: 25 mmol/L (ref 22–32)
CREATININE: 0.95 mg/dL (ref 0.61–1.24)
Calcium: 8.8 mg/dL — ABNORMAL LOW (ref 8.9–10.3)
GFR calc Af Amer: >60 mL/min (ref >60)
GFR calc non Af Amer: >60 mL/min (ref >60)
Glucose, Bld: 99 mg/dL (ref 65–99)
Potassium: 3.6 mmol/L (ref 3.5–5.1)
SODIUM: 136 mmol/L (ref 135–145)

## 2016-02-29 MED ORDER — CIPROFLOXACIN HCL 500 MG PO TABS
500.0000 mg | ORAL_TABLET | Freq: Two times a day (BID) | ORAL | Status: DC
  Administered 2016-02-29 – 2016-03-01 (×3): 500 mg via ORAL
  Filled 2016-02-29 (×3): qty 1

## 2016-02-29 MED ORDER — IBUPROFEN 600 MG PO TABS
600.0000 mg | ORAL_TABLET | Freq: Four times a day (QID) | ORAL | Status: DC | PRN
  Filled 2016-02-29: qty 1

## 2016-02-29 NOTE — Progress Notes (Signed)
Repeat temperature 102.53F orally. Dr. Catha GosselinMikhail notified. She states she will order motrin in approximately 30 minutes. Will give motrin when ordered and continue to monitor. Lenord CarboAubrey  Keylen Eckenrode

## 2016-02-29 NOTE — Progress Notes (Signed)
Nursing student notified writer that patient's oral temperature was 102.10F. Tylenol given. Dr. Catha GosselinMikhail notified via page and returned phone call. No new orders at this time. Will recheck temp in an hour and continue to monitor. Lenord CarboAubrey  Magalene Mclear 3:26 PM

## 2016-02-29 NOTE — Progress Notes (Signed)
PROGRESS NOTE    Andre DurandHector M Wong  WUJ:[id number] DOB: 10/27/1985 DOA: 02/28/2016 PCP: No PCP Per Patient   Chief Complaint  Patient presents with  . Abdominal Pain    Brief Narrative:  HPI on 02/28/2016 by Dr. Shelly Flattenavid Merrell Andre DurandHector M Wong is a 30 y.o. male with no known medical conditions. Abdominal pain started 2-3 days ago. Left lower quadrant. Initially very mild and intermittent but now constant and getting worse. Poor oral intake over the last day due to pain. Associated with some nausea but denies any hematochezia, melena, hematemesis, emesis, chest pain, shortness of breath, palpitations, dysuria, frequency, neck stiffness. Patient does endorse onset of subjective fevers and chills overnight. Patient sent to the hospital from work after his boss noticed how ill-appearing few left.  Assessment & Plan   Sepsis secondary to diverticulitis -Patient presented with leukocytosis and was noted to be febrile -CT abdomen and pelvis showed mild to moderate diverticulitis involving the proximal sigmoid colon. No evidence of abscess or other complication -Patient she started on Flagyl as well as ceftriaxone. Will change this to ciprofloxacin with Flagyl -Blood cultures pending -Patient continues to have leukocytosis which is increasing from admission as well as fever -Currently on clear liquid diet, will advance as tolerated  Intractable abdominal pain -Likely secondary to the above -Continue to monitor, antiemetics and pain control  DVT Prophylaxis  lovenox  Code Status: Full  Family Communication: None at bedside  Disposition Plan: observation. Home when stable  Consultants None  Procedures  None  Antibiotics   Anti-infectives    Start     Dose/Rate Route Frequency Ordered Stop   02/29/16 1700  cefTRIAXone (ROCEPHIN) 2 g in dextrose 5 % 50 mL IVPB  Status:  Discontinued     2 g 100 mL/hr over 30 Minutes Intravenous Every 24 hours 02/28/16 1728 02/29/16 1018   02/29/16 1030  ciprofloxacin (CIPRO) tablet 500 mg     500 mg Oral 2 times daily 02/29/16 1018     02/28/16 2030  metroNIDAZOLE (FLAGYL) 50 mg/ml oral suspension 500 mg     500 mg Oral 3 times daily 02/28/16 1726     02/28/16 1500  cefTRIAXone (ROCEPHIN) 2 g in dextrose 5 % 50 mL IVPB     2 g 100 mL/hr over 30 Minutes Intravenous  Once 02/28/16 1452 02/28/16 1728   02/28/16 1500  metroNIDAZOLE (FLAGYL) IVPB 500 mg     500 mg 100 mL/hr over 60 Minutes Intravenous  Once 02/28/16 1452 02/28/16 1728      Subjective:   Jon BillingsHector Wong seen and examined today.  She continues to complain of abdominal pain particularly in the left lower quadrant. Denies any chest pain, shortness of breath, diarrhea, dizziness or headache. Does have some nausea.  Objective:   Vitals:   02/28/16 1900 02/28/16 2012 02/29/16 0521 02/29/16 0656  BP: 118/70 119/71 (!) 91/43 (!) 107/55  Pulse: 89 (!) 102 85 81  Resp:  20 18   Temp:  (!) 101 F (38.3 C) (!) 101.3 F (38.5 C)   TempSrc:  Oral Oral   SpO2: 97% 100% 98%     Intake/Output Summary (Last 24 hours) at 02/29/16 1334 Last data filed at 02/28/16 1510  Gross per 24 hour  Intake             1000 ml  Output                0 ml  Net  1000 ml   There were no vitals filed for this visit.  Exam  General: Well developed, well nourished, NAD, appears stated age  HEENT: NCAT, mucous membranes moist.   Cardiovascular: S1 S2 auscultated, no rubs, murmurs or gallops. Regular rate and rhythm.  Respiratory: Clear to auscultation bilaterally with equal chest rise  Abdomen: Soft, LLQ TTP, nondistended, + bowel sounds  Extremities: warm dry without cyanosis clubbing or edema  Neuro: AAOx3, Nonfocal  Psych: Normal affect and demeanor with intact judgement and insight   Data Reviewed: I have personally reviewed following labs and imaging studies  CBC:  Recent Labs Lab 02/28/16 0851 02/29/16 0554  WBC 13.4* 14.6*  HGB 15.1 14.2  HCT  45.2 42.7  MCV 90.8 91.4  PLT 193 212   Basic Metabolic Panel:  Recent Labs Lab 02/28/16 0851 02/29/16 0554  NA 139 136  K 3.9 3.6  CL 104 101  CO2 26 25  GLUCOSE 99 99  BUN 16 8  CREATININE 0.90 0.95  CALCIUM 9.0 8.8*   GFR: CrCl cannot be calculated (Unknown ideal weight.). Liver Function Tests:  Recent Labs Lab 02/28/16 0851  AST 18  ALT 26  ALKPHOS 50  BILITOT 1.1  PROT 6.5  ALBUMIN 3.8    Recent Labs Lab 02/28/16 0851  LIPASE 24   No results for input(s): AMMONIA in the last 168 hours. Coagulation Profile: No results for input(s): INR, PROTIME in the last 168 hours. Cardiac Enzymes: No results for input(s): CKTOTAL, CKMB, CKMBINDEX, TROPONINI in the last 168 hours. BNP (last 3 results) No results for input(s): PROBNP in the last 8760 hours. HbA1C: No results for input(s): HGBA1C in the last 72 hours. CBG: No results for input(s): GLUCAP in the last 168 hours. Lipid Profile: No results for input(s): CHOL, HDL, LDLCALC, TRIG, CHOLHDL, LDLDIRECT in the last 72 hours. Thyroid Function Tests: No results for input(s): TSH, T4TOTAL, FREET4, T3FREE, THYROIDAB in the last 72 hours. Anemia Panel: No results for input(s): VITAMINB12, FOLATE, FERRITIN, TIBC, IRON, RETICCTPCT in the last 72 hours. Urine analysis:    Component Value Date/Time   COLORURINE YELLOW 02/28/2016 0856   APPEARANCEUR CLEAR 02/28/2016 0856   LABSPEC 1.010 02/28/2016 0856   PHURINE 6.5 02/28/2016 0856   GLUCOSEU NEGATIVE 02/28/2016 0856   HGBUR NEGATIVE 02/28/2016 0856   BILIRUBINUR NEGATIVE 02/28/2016 0856   KETONESUR NEGATIVE 02/28/2016 0856   PROTEINUR NEGATIVE 02/28/2016 0856   NITRITE NEGATIVE 02/28/2016 0856   LEUKOCYTESUR NEGATIVE 02/28/2016 0856   Sepsis Labs: @LABRCNTIP (procalcitonin:4,lacticidven:4)  ) Recent Results (from the past 240 hour(s))  Urine culture     Status: None   Collection Time: 02/28/16  8:56 AM  Result Value Ref Range Status   Specimen  Description URINE, RANDOM  Final   Special Requests NONE  Final   Culture NO GROWTH  Final   Report Status 02/29/2016 FINAL  Final      Radiology Studies: Ct Abdomen Pelvis W Contrast  Result Date: 02/28/2016 CLINICAL DATA:  Lower abdominal pain for 4 days.  Nausea.  Dysuria. EXAM: CT ABDOMEN AND PELVIS WITH CONTRAST TECHNIQUE: Multidetector CT imaging of the abdomen and pelvis was performed using the standard protocol following bolus administration of intravenous contrast. CONTRAST:  ISOVUE-300 IOPAMIDOL (ISOVUE-300) INJECTION 61% COMPARISON:  None. FINDINGS: Lower Chest: No acute findings. Hepatobiliary: No masses identified. Mild hepatic steatosis. Gallbladder is unremarkable. Pancreas:  No mass or inflammatory changes. Spleen: Within normal limits in size and appearance. Adrenals/Urinary Tract: No masses identified. No evidence  of hydronephrosis. Stomach/Bowel: Mild to moderate diverticulitis is seen involving the proximal sigmoid colon. No evidence of abscess or extraluminal gas. Normal appendix visualized. No evidence of bowel obstruction. Vascular/Lymphatic: No pathologically enlarged lymph nodes. No abdominal aortic aneurysm. Reproductive:  No mass identified. Other:  None. Musculoskeletal:  No suspicious bone lesions identified. IMPRESSION: Mild to moderate diverticulitis involving proximal sigmoid colon. No evidence of abscess or other complication. Mild hepatic steatosis. Electronically Signed   By: Myles RosenthalJohn  Stahl M.D.   On: 02/28/2016 12:27     Scheduled Meds: . ciprofloxacin  500 mg Oral BID  . enoxaparin (LOVENOX) injection  40 mg Subcutaneous Q24H  . Influenza vac split quadrivalent PF  0.5 mL Intramuscular Tomorrow-1000  . metroNIDAZOLE  500 mg Oral TID   Continuous Infusions: . sodium chloride 75 mL/hr at 02/29/16 1106     LOS: 0 days   Time Spent in minutes   30 minutes  Alfonzia Woolum D.O. on 02/29/2016 at 1:34 PM  Between 7am to 7pm - Pager -  [phone]  After 7pm go to [email or web address] - password [email or web address]  And look for the night coverage person covering for me after hours  [facility] Office  [phone]

## 2016-03-01 LAB — BASIC METABOLIC PANEL
ANION GAP: 9 (ref 5–15)
BUN: 7 mg/dL (ref 6–20)
CHLORIDE: 102 mmol/L (ref 101–111)
CO2: 27 mmol/L (ref 22–32)
Calcium: 8.8 mg/dL — ABNORMAL LOW (ref 8.9–10.3)
Creatinine, Ser: 0.95 mg/dL (ref 0.61–1.24)
GFR calc non Af Amer: >60 mL/min (ref >60)
Glucose, Bld: 111 mg/dL — ABNORMAL HIGH (ref 65–99)
POTASSIUM: 3.5 mmol/L (ref 3.5–5.1)
Sodium: 138 mmol/L (ref 135–145)

## 2016-03-01 LAB — CBC
HEMATOCRIT: 43.7 % (ref 39.0–52.0)
HEMOGLOBIN: 14.4 g/dL (ref 13.0–17.0)
MCH: 30.3 pg (ref 26.0–34.0)
MCHC: 33 g/dL (ref 30.0–36.0)
MCV: 92 fL (ref 78.0–100.0)
Platelets: 211 10*3/uL (ref 150–400)
RBC: 4.75 MIL/uL (ref 4.22–5.81)
RDW: 13.3 % (ref 11.5–15.5)
WBC: 13.6 10*3/uL — AB (ref 4.0–10.5)

## 2016-03-01 MED ORDER — CIPROFLOXACIN HCL 500 MG PO TABS: 500.0000 mg | ORAL_TABLET | Freq: Two times a day (BID) | ORAL | 0 refills | Status: DC

## 2016-03-01 MED ORDER — METRONIDAZOLE 500 MG PO TABS: 500.0000 mg | ORAL_TABLET | Freq: Three times a day (TID) | ORAL | 0 refills | Status: DC

## 2016-03-01 NOTE — Progress Notes (Signed)
Nsg Discharge Note  Admit Date:  02/28/2016 Discharge date: 03/01/2016   Andre Wong to be D/C'd Home per MD order.  AVS completed.  Copy for chart, and copy for patient signed, and dated. Patient/caregiver able to verbalize understanding.  Discharge Medication:   Medication List    STOP taking these medications   indomethacin 25 MG capsule Commonly known as:  INDOCIN     TAKE these medications   ciprofloxacin 500 MG tablet Commonly known as:  CIPRO Take 1 tablet (500 mg total) by mouth 2 (two) times daily.   ibuprofen 600 MG tablet Commonly known as:  ADVIL,MOTRIN Take 1 tablet (600 mg total) by mouth every 8 (eight) hours as needed. What changed:  reasons to take this   metroNIDAZOLE 500 MG tablet Commonly known as:  FLAGYL Take 1 tablet (500 mg total) by mouth 3 (three) times daily.       Discharge Assessment: Vitals:   02/29/16 2115 03/01/16 0655  BP: 123/66 116/69  Pulse: 85 71  Resp: 20 18  Temp: 98.7 F (37.1 C) 98 F (36.7 C)   Skin clean, dry and intact without evidence of skin break down, no evidence of skin tears noted. IV catheter discontinued intact. Site without signs and symptoms of complications - no redness or edema noted at insertion site, patient denies c/o pain - only slight tenderness at site.  Dressing with slight pressure applied.  D/c Instructions-Education: Discharge instructions given to patient/family with verbalized understanding. D/c education completed with patient/family including follow up instructions, medication list, d/c activities limitations if indicated, with other d/c instructions as indicated by MD - patient able to verbalize understanding, all questions fully answered. Patient instructed to return to ED, call 911, or call MD for any changes in condition.  Patient escorted via WC, and D/C home via private auto.  Andre FlamingVicki L [name], RN 03/01/2016 2:36 PM

## 2016-03-01 NOTE — Discharge Summary (Signed)
Physician Discharge Summary  Andre Wong:[id number] DOB: Nov 05, 1985 DOA: 02/28/2016  PCP: No PCP Per Patient  Admit date: 02/28/2016 Discharge date: 03/01/2016  Time spent: 45 minutes  Recommendations for Outpatient Follow-up:  Patient will be discharged to home.  Patient will need to follow up with primary care provider within one week of discharge.  Patient should continue medications as prescribed.  Patient should follow a soft diet and advance to a regular diet as tolerated.  Discharge Diagnoses:  Sepsis secondary to diverticulitis Intractable abdominal pain  Discharge Condition: Stable  Diet recommendation: soft diet  There were no vitals filed for this visit.  History of present illness:  on 02/28/2016 by Dr. Ortencia Kick Bautistais a 29 y.o.malewith no known medical conditions. Abdominal pain started 2-3 days ago. Left lower quadrant. Initially very mild and intermittent but now constant and getting worse. Poor oral intake over the last day due to pain. Associated with some nausea but denies any hematochezia, melena, hematemesis, emesis, chest pain, shortness of breath, palpitations, dysuria, frequency, neck stiffness. Patient does endorse onset of subjective fevers and chills overnight. Patient sent to the hospital from work after his boss noticed how ill-appearing few left.   Hospital Course:  Sepsis secondary to diverticulitis -Patient presented with leukocytosis and was noted to be febrile -CT abdomen and pelvis showed mild to moderate diverticulitis involving the proximal sigmoid colon. No evidence of abscess or other complication -Patient she started on Flagyl as well as ceftriaxone. Will change this to ciprofloxacin with Flagyl -Blood cultures show no growth to date -Urine culture shows no growth -Patient continues to have leukocytosis which is increasing from admission as well as fever -Advanced to soft diet, tolerated it well -Discharge  patient with cipro/flagyl  Intractable abdominal pain -Likely secondary to the above -Continue to monitor, antiemetics and pain control  Consultants None  Procedures  None  Discharge Exam: Vitals:   02/29/16 2115 03/01/16 0655  BP: 123/66 116/69  Pulse: 85 71  Resp: 20 18  Temp: 98.7 F (37.1 C) 98 F (36.7 C)   Feels pain has improved. No longer has pain with urination in the LLQ.  Denies any chest pain, shortness of breath, nausea, vomiting, diarrhea, dizziness or headache.  Exam  General: Well developed, well nourished, NAD  HEENT: NCAT, mucous membranes moist.   Cardiovascular: S1 S2 auscultated, RRR, no murmurs   Respiratory: Clear to auscultation bilaterally with equal chest rise  Abdomen: Soft, mild LLQ TTP, nondistended, + bowel sounds  Extremities: warm dry without cyanosis clubbing or edema  Neuro: AAOx3, Nonfocal  Psych: Normal affect and demeanor with intact judgement and insight  Discharge Instructions Discharge Instructions    Discharge instructions    Complete by:  As directed    Patient will be discharged to home.  Patient will need to follow up with primary care provider within one week of discharge.  Patient should continue medications as prescribed.  Patient should follow a soft diet and advance to a regular diet as tolerated. Follow up with gastroenterology as needed.     Current Discharge Medication List    START taking these medications   Details  ciprofloxacin (CIPRO) 500 MG tablet Take 1 tablet (500 mg total) by mouth 2 (two) times daily. Qty: 20 tablet, Refills: 0    metroNIDAZOLE (FLAGYL) 500 MG tablet Take 1 tablet (500 mg total) by mouth 3 (three) times daily. Qty: 30 tablet, Refills: 0      CONTINUE these medications which  have NOT CHANGED   Details  ibuprofen (ADVIL,MOTRIN) 600 MG tablet Take 1 tablet (600 mg total) by mouth every 8 (eight) hours as needed. Qty: 10 tablet, Refills: 0      STOP taking these medications       indomethacin (INDOCIN) 25 MG capsule        No Known Allergies Follow-up Information    [facility]. Schedule an appointment as soon as possible for a visit.   Why:  As needed for abdominal pain and diverticulitis Contact information: [address] [place] [address] [phone]            The results of significant diagnostics from this hospitalization (including imaging, microbiology, ancillary and laboratory) are listed below for reference.    Significant Diagnostic Studies: Ct Abdomen Pelvis W Contrast  Result Date: 02/28/2016 CLINICAL DATA:  Lower abdominal pain for 4 days.  Nausea.  Dysuria. EXAM: CT ABDOMEN AND PELVIS WITH CONTRAST TECHNIQUE: Multidetector CT imaging of the abdomen and pelvis was performed using the standard protocol following bolus administration of intravenous contrast. CONTRAST:  100mL ISOVUE-300 IOPAMIDOL (ISOVUE-300) INJECTION 61% COMPARISON:  None. FINDINGS: Lower Chest: No acute findings. Hepatobiliary: No masses identified. Mild hepatic steatosis. Gallbladder is unremarkable. Pancreas:  No mass or inflammatory changes. Spleen: Within normal limits in size and appearance. Adrenals/Urinary Tract: No masses identified. No evidence of hydronephrosis. Stomach/Bowel: Mild to moderate diverticulitis is seen involving the proximal sigmoid colon. No evidence of abscess or extraluminal gas. Normal appendix visualized. No evidence of bowel obstruction. Vascular/Lymphatic: No pathologically enlarged lymph nodes. No abdominal aortic aneurysm. Reproductive:  No mass identified. Other:  None. Musculoskeletal:  No suspicious bone lesions identified. IMPRESSION: Mild to moderate diverticulitis involving proximal sigmoid colon. No evidence of abscess or other complication. Mild hepatic steatosis. Electronically Signed   By: Myles RosenthalJohn  Stahl M.D.   On: 02/28/2016 12:27    Microbiology: Recent Results (from the past 240 hour(s))  Urine culture      Status: None   Collection Time: 02/28/16  8:56 AM  Result Value Ref Range Status   Specimen Description URINE, RANDOM  Final   Special Requests NONE  Final   Culture NO GROWTH  Final   Report Status 02/29/2016 FINAL  Final  Culture, blood (routine x 2)     Status: None (Preliminary result)   Collection Time: 02/28/16  5:48 PM  Result Value Ref Range Status   Specimen Description BLOOD RIGHT ANTECUBITAL  Final   Special Requests BOTTLES DRAWN AEROBIC AND ANAEROBIC 5CC  Final   Culture NO GROWTH < 24 HOURS  Final   Report Status PENDING  Incomplete  Culture, blood (routine x 2)     Status: None (Preliminary result)   Collection Time: 02/28/16  5:52 PM  Result Value Ref Range Status   Specimen Description BLOOD RIGHT HAND  Final   Special Requests BOTTLES DRAWN AEROBIC AND ANAEROBIC 5CC  Final   Culture NO GROWTH < 24 HOURS  Final   Report Status PENDING  Incomplete     Labs: Basic Metabolic Panel:  Recent Labs Lab 02/28/16 0851 02/29/16 0554 03/01/16 0730  NA 139 136 138  K 3.9 3.6 3.5  CL 104 101 102  CO2 26 25 27   GLUCOSE 99 99 111*  BUN 16 8 7   CREATININE 0.90 0.95 0.95  CALCIUM 9.0 8.8* 8.8*   Liver Function Tests:  Recent Labs Lab 02/28/16 0851  AST 18  ALT 26  ALKPHOS 50  BILITOT  1.1  PROT 6.5  ALBUMIN 3.8    Recent Labs Lab 02/28/16 0851  LIPASE 24   No results for input(s): AMMONIA in the last 168 hours. CBC:  Recent Labs Lab 02/28/16 0851 02/29/16 0554 03/01/16 0730  WBC 13.4* 14.6* 13.6*  HGB 15.1 14.2 14.4  HCT 45.2 42.7 43.7  MCV 90.8 91.4 92.0  PLT 193 212 211   Cardiac Enzymes: No results for input(s): CKTOTAL, CKMB, CKMBINDEX, TROPONINI in the last 168 hours. BNP: BNP (last 3 results) No results for input(s): BNP in the last 8760 hours.  ProBNP (last 3 results) No results for input(s): PROBNP in the last 8760 hours.  CBG: No results for input(s): GLUCAP in the last 168 hours.     SignedEdsel Petrin:  Deakon Frix  [facility] 03/01/2016, 12:28 PM

## 2016-03-01 NOTE — Care Management Note (Signed)
Case Management Note  Patient Details  Name: Glenna DurandHector M Bautista MRN: [id number] Date of Birth: 10-28-1985  Subjective/Objective:                 [facility]. Instructed to schedule follow up appointment, office closed at this time. Patient provided with Good Rx coupon for Flagyl to bring cost under $10, [facility] and costs $5. Patient verified that [facility] card.     Action/Plan:  DC to home self care.   Expected Discharge Date:                  Expected Discharge Plan:  Home/Self Care  In-House Referral:     Discharge planning Services  CM Consult, Indigent Health Clinic, Medication Assistance  Post Acute Care Choice:    Choice offered to:     DME Arranged:    DME Agency:     HH Arranged:    HH Agency:     Status of Service:  Completed, signed off  If discussed at [employer] Length of Stay Meetings, dates discussed:    Additional Comments:  Lawerance SabalDebbie Necha Harries, RN 03/01/2016, 1:41 PM

## 2016-03-01 NOTE — Discharge Instructions (Signed)
Diverticulitis (Diverticulitis) La diverticulitis ocurre cuando pequeos bolsillos que se han formado en el colon (intestino grueso) se infectan o se inflaman. CUIDADOS EN EL HOGAR  Siga las indicaciones del mdico.  Siga una dieta especial si el mdico se lo indic.  Cuando se sienta mejor, el mdico puede indicarle que cambie la dieta. Tal vez le indiquen que coma gran cantidad de fibra. Las frutas y los vegetales son buenas fuentes de fibra. La fibra facilita la evacuacin intestinal (defecacin).  Tome los suplementos o los probiticos como le indic el mdico.  Tome los medicamentos solamente como se lo haya indicado el mdico.  Cumpla con todas las visitas de control con su mdico.  SOLICITE AYUDA SI:  El dolor no mejora.  Le resulta difcil alimentarse.  No defeca como lo hace normalmente.  SOLICITE AYUDA DE INMEDIATO SI:  El dolor empeora.  Los problemas no mejoran.  Los problemas empeoran repentinamente.  Tiene fiebre.  No deja de vomitar.  La materia fecal (heces) es sanguinolenta o negra, de aspecto alquitranado.  ASEGRESE DE QUE:  Comprende estas instrucciones.  Controlar su afeccin.  Recibir ayuda de inmediato si no mejora o si empeora.  Esta informacin no tiene como fin reemplazar el consejo del mdico. Asegrese de hacerle al mdico cualquier pregunta que tenga. Document Released: 03/21/2011 Document Revised: 04/06/2013 Document Reviewed: 02/24/2013 Elsevier Interactive Patient Education  2017 Elsevier Inc.  

## 2016-03-04 LAB — CULTURE, BLOOD (ROUTINE X 2)
CULTURE: NO GROWTH
Culture: NO GROWTH

## 2016-03-06 ENCOUNTER — Ambulatory Visit: Payer: Self-pay | Attending: Internal Medicine | Admitting: Physician Assistant

## 2016-03-06 VITALS — BP 116/75 | HR 65 | Temp 98.5°F | Resp 16 | Wt 184.2 lb

## 2016-03-06 DIAGNOSIS — A419 Sepsis, unspecified organism: Secondary | ICD-10-CM | POA: Insufficient documentation

## 2016-03-06 DIAGNOSIS — K219 Gastro-esophageal reflux disease without esophagitis: Secondary | ICD-10-CM | POA: Insufficient documentation

## 2016-03-06 DIAGNOSIS — K5732 Diverticulitis of large intestine without perforation or abscess without bleeding: Secondary | ICD-10-CM | POA: Insufficient documentation

## 2016-03-06 NOTE — Progress Notes (Signed)
Pt is in the office today for a hospital follow up  Pt states he is not in any pain  

## 2016-03-06 NOTE — Progress Notes (Signed)
Andre BillingsHector Wong  ZOX:[id number]  WJX:[id number]  DOB - 12-30-1985  Chief Complaint  Patient presents with  . Hospitalization Follow-up       Subjective:   Andre BillingsHector Wong is a 30 y.o. male here today for establishment of care. He was hospitalized from November 15 thousand 17 through 03/01/2016 for sepsis secondary to diverticulitis. He initially presented with abdominal pain in the left lower quadrant for 4 days. He had associated nausea and chills. He had a difficult time sleeping. He did not have any urinary symptoms. His vital signs were stable his white count was elevated at 13,000. A CT of the abdomen and pelvis showed mild-to-moderate diverticular disease involving the proximal sigmoid colon. There was no evidence of abscess or perforation. The internal medicine service admitted for IV pain medications, IV antibiotics, and IV fluids. He was sent home with a course of Cipro and Flagyl.   He has been compliant with his medications. His abdominal pain has subsided. He is back to work. No nausea or vomiting. No new complaints.      ROS: GEN: denies fever or chills, denies change in weight Skin: denies lesions or rashes HEENT: denies headache, earache, epistaxis, sore throat, or neck pain LUNGS: denies SHOB, dyspnea, PND, orthopnea CV: denies CP or palpitations ABD: denies abd pain, N or V EXT: denies muscle spasms or swelling; no pain in lower ext, no weakness NEURO: denies numbness or tingling, denies sz, stroke or TIA  ALLERGIES: No Known Allergies  PAST MEDICAL HISTORY: Past Medical History:  Diagnosis Date  . Diverticulitis 02/28/2016  . GERD (gastroesophageal reflux disease)     PAST SURGICAL HISTORY: Past Surgical History:  Procedure Laterality Date  . NO PAST SURGERIES      MEDICATIONS AT HOME: Prior to Admission medications   Medication Sig Start Date End Date Taking? Authorizing Provider  ciprofloxacin (CIPRO) 500 MG tablet Take 1 tablet (500 mg total)  by mouth 2 (two) times daily. 03/01/16  Yes Maryann Mikhail, DO  metroNIDAZOLE (FLAGYL) 500 MG tablet Take 1 tablet (500 mg total) by mouth 3 (three) times daily. 03/01/16  Yes Maryann Mikhail, DO  ibuprofen (ADVIL,MOTRIN) 600 MG tablet Take 1 tablet (600 mg total) by mouth every 8 (eight) hours as needed. Patient not taking: Reported on 03/06/2016 06/19/15   Benjiman CoreNathan Pickering, MD     Objective:   Vitals:   03/06/16 0935  BP: 116/75  Pulse: 65  Resp: 16  Temp: 98.5 F (36.9 C)  TempSrc: Oral  SpO2: 99%  Weight: 184 lb 3.2 oz (83.6 kg)    Exam General appearance : Awake, alert, not in any distress. Speech Clear. Not toxic looking HEENT: Atraumatic and Normocephalic, pupils equally reactive to light and accomodation Neck: supple, no JVD. No cervical lymphadenopathy.  Chest:Good air entry bilaterally, no added sounds  CVS: S1 S2 regular, no murmurs.  Abdomen: Bowel sounds present, Non tender and not distended with no gaurding, rigidity or rebound. Extremities: B/L Lower Ext shows no edema, both legs are warm to touch Neurology: Awake alert, and oriented X 3, CN II-XII intact, Non focal Skin:No Rash Wounds:N/A   Assessment & Plan  1. Sepsis 2/2 diverticultis  -complete course of Flagyl and Cipro  -Cont to advance diet  -Avoid triggers   Return in about 4 weeks (around 04/03/2016). For routine health maintenance.  The patient was given clear instructions to go to ER or return to medical center if symptoms don't improve, worsen or new problems develop. The patient verbalized  understanding. The patient was told to call to get lab results if they haven't heard anything in the next week.   This note has been created with Education officer, environmentalDragon speech recognition software and smart phrase technology. Any transcriptional errors are unintentional.    Scot Juniffany Raevon Broom, PA-C [facility] [place] [phone]   03/06/2016, 9:43 AM

## 2017-10-22 ENCOUNTER — Encounter (HOSPITAL_COMMUNITY): Payer: Self-pay | Admitting: Emergency Medicine

## 2017-10-22 ENCOUNTER — Ambulatory Visit (HOSPITAL_COMMUNITY)
Admission: EM | Admit: 2017-10-22 | Discharge: 2017-10-22 | Disposition: A | Discharge status: Home / Self Care | Payer: Self-pay | Attending: Family Medicine | Admitting: Family Medicine

## 2017-10-22 ENCOUNTER — Ambulatory Visit (INDEPENDENT_AMBULATORY_CARE_PROVIDER_SITE_OTHER): Payer: Self-pay

## 2017-10-22 DIAGNOSIS — R079 Chest pain, unspecified: Secondary | ICD-10-CM

## 2017-10-22 MED ORDER — BACITRACIN ZINC 500 UNIT/GM EX OINT
TOPICAL_OINTMENT | CUTANEOUS | Status: AC
  Filled 2017-10-22: qty 0.9

## 2017-10-22 MED ORDER — KETOROLAC TROMETHAMINE 60 MG/2ML IM SOLN
INTRAMUSCULAR | Status: AC
  Filled 2017-10-22: qty 2

## 2017-10-22 MED ORDER — KETOROLAC TROMETHAMINE 60 MG/2ML IM SOLN
60.0000 mg | Freq: Once | INTRAMUSCULAR | Status: AC
  Administered 2017-10-22: 60 mg via INTRAMUSCULAR

## 2017-10-22 MED ORDER — BENZONATATE 100 MG PO CAPS: 100.0000 mg | ORAL_CAPSULE | Freq: Three times a day (TID) | ORAL | 0 refills | Status: AC

## 2017-10-22 MED ORDER — NAPROXEN 500 MG PO TABS: 500.0000 mg | ORAL_TABLET | Freq: Two times a day (BID) | ORAL | 0 refills | Status: DC

## 2017-10-22 NOTE — ED Triage Notes (Signed)
Pt sts mid sternal CP x 2 weeks

## 2017-10-22 NOTE — ED Provider Notes (Signed)
[facility]    CSN: [id number] Arrival date & time: 10/22/17  1105     History   Chief Complaint Chief Complaint  Patient presents with  . Chest Pain    HPI Andre Wong is a 32 y.o. male.   Patient is a 32 year old male with no significant medical history.  He has had 2 weeks of deep central chest pain worse in the morning and evenings.  He describes the pain as a pressure.  Denies any radiation to back or arm.  Denies any numbness tingling.  States the pain does not get worse with movement, breathing, palpation.  He has also had a dry cough for 3 weeks.  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  Yesterday morning he took some Aleve with little relief and the pain returned.  He does not smoke and is a light drinker.  He denies drug use.  He is admitting to some shortness of breath with sitting in the exam room.  Unsure of family history of cardiac issues.    Chest Pain  Associated symptoms: cough, shortness of breath and weakness   Associated symptoms: no abdominal pain, no fatigue and no nausea     Past Medical History:  Diagnosis Date  . Diverticulitis 02/28/2016  . GERD (gastroesophageal reflux disease)     Patient Active Problem List   Diagnosis Date Noted  . Diverticulitis 02/28/2016  . Intractable abdominal pain 02/28/2016  . Leukocytosis 02/28/2016    Past Surgical History:  Procedure Laterality Date  . NO PAST SURGERIES         Home Medications    Prior to Admission medications   Medication Sig Start Date End Date Taking? Authorizing Provider  benzonatate (TESSALON) 100 MG capsule Take 1 capsule (100 mg total) by mouth every 8 (eight) hours. 10/22/17   Dahlia Byes A, NP  ciprofloxacin (CIPRO) 500 MG tablet Take 1 tablet (500 mg total) by mouth 2 (two) times daily. Patient not taking: Reported on 10/22/2017 03/01/16   Edsel Petrin, DO  ibuprofen (ADVIL,MOTRIN) 600 MG tablet Take 1 tablet (600 mg total) by  mouth every 8 (eight) hours as needed. Patient not taking: Reported on 03/06/2016 06/19/15   Benjiman Core, MD  metroNIDAZOLE (FLAGYL) 500 MG tablet Take 1 tablet (500 mg total) by mouth 3 (three) times daily. Patient not taking: Reported on 10/22/2017 03/01/16   Edsel Petrin, DO  naproxen (NAPROSYN) 500 MG tablet Take 1 tablet (500 mg total) by mouth 2 (two) times daily. 10/22/17   Janace Aris, NP    Family History Family History  Problem Relation Age of Onset  . Diabetes Mother     Social History Social History   Tobacco Use  . Smoking status: Former Smoker    Types: Cigarettes  . Smokeless tobacco: Never Used  . Tobacco comment: 02/28/2016 "quit smoking 6-7 yr ago; smoked ~ 1 cigarette/month; smoked 1-2 years"  Substance Use Topics  . Alcohol use: Yes    Alcohol/week: 14.4 oz    Types: 24 Cans of beer per week    Comment: 02/28/2016 "I don't drink q weekend; when I do it's ~ 24 beers"  . Drug use: No     Allergies   Patient has no known allergies.   Review of Systems Review of Systems  Constitutional: Negative for chills and fatigue.  HENT: Negative for congestion.   Respiratory: Positive for cough and shortness of breath.   Cardiovascular:  Positive for chest pain.  Gastrointestinal: Negative for abdominal distention, abdominal pain and nausea.  Skin: Negative for rash.  Allergic/Immunologic: Negative for immunocompromised state.  Neurological: Positive for weakness.  All other systems reviewed and are negative.    Physical Exam Triage Vital Signs ED Triage Vitals [10/22/17 1145]  Enc Vitals Group     BP 127/78     Pulse Rate 63     Resp 18     Temp 98.4 F (36.9 C)     Temp Source Oral     SpO2 97 %     Weight      Height      Head Circumference      Peak Flow      Pain Score      Pain Loc      Pain Edu?      Excl. in GC?    No data found.  Updated Vital Signs BP 127/78 (BP Location: Left Arm)   Pulse 63   Temp 98.4 F (36.9 C)  (Oral)   Resp 18   SpO2 97%   Visual Acuity Right Eye Distance:   Left Eye Distance:   Bilateral Distance:    Right Eye Near:   Left Eye Near:    Bilateral Near:     Physical Exam  Constitutional: He is oriented to person, place, and time. He appears well-developed and well-nourished.  HENT:  Head: Normocephalic.  Eyes: Pupils are equal, round, and reactive to light.  Neck: Normal range of motion.  Cardiovascular: Normal rate, regular rhythm and normal pulses. Exam reveals no gallop, no S3, no S4, no distant heart sounds and no friction rub.  No murmur heard.  No systolic murmur is present. Pulmonary/Chest: Effort normal and breath sounds normal.  Abdominal: Soft.  Neurological: He is alert and oriented to person, place, and time.  Skin: Skin is warm and dry.  Nursing note and vitals reviewed.    UC Treatments / Results  Labs (all labs ordered are listed, but only abnormal results are displayed) Labs Reviewed - No data to display  EKG None  Radiology Dg Chest 2 View  Result Date: 10/22/2017 CLINICAL DATA:  Two weeks of mid sternal chest pain with mild cough and shortness of breath. No cardiopulmonary history. Former smoker. EXAM: CHEST - 2 VIEW COMPARISON:  PA and lateral chest x-ray of June 18, 2015 FINDINGS: The lungs are mildly hypoinflated. The interstitial markings are coarse though stable. There is a stable subcentimeter nodule just above the midportion of the right hemidiaphragm. The heart is top-normal in size. The pulmonary vascularity is not engorged. The mediastinum is normal in width. There is no pleural effusion. The bony thorax is unremarkable. IMPRESSION: Chronic interstitial changes likely reflecting the patient's smoking history. Probable previous granulomatous infection. No acute cardiopulmonary abnormality. Electronically Signed   By: David  Swazil[place] M.D.   On: 10/22/2017 12:23    Procedures Procedures (including critical care time)  Medications Ordered  in UC Medications  ketorolac (TORADOL) injection 60 mg (60 mg Intramuscular Given 10/22/17 1321)    Initial Impression / Assessment and Plan / UC Course  I have reviewed the triage vital signs and the nursing notes.  Pertinent labs & imaging results that were available during my care of the patient were reviewed by me and considered in my medical decision making (see chart for details).     EKG normal.  Will obtain chest x-ray based on 2 weeks of dry cough, chest pressure.  Most likely musculoskeletal chest pain.    No acute findings on chest x-ray.  Chronic interstitial changes .   Will have follow-up with community health and wellness for possible CT scan of chest.  Will treat with NSAIDs and Tessalon for cough.  Toradol given for pain in clinic.  Toradol did relieve the pain.  This is reassuring more likely musculoskeletal.  Return precautions given.  Told to go to the emergency room for worsening symptoms. Final Clinical Impressions(s) / UC Diagnoses   Final diagnoses:  Chest pain, unspecified type     Discharge Instructions     It was nice meeting you!!  Your EKG was normal.  Your chest x ray showed some chronic changes in the lungs. Nothing worrisome today.  We gave you a shot of Toradol for pain in your chest.  You may want to follow up with [facility] and wellness to have further imaging done like a CT scan to get a better look at the chest.  I will prescribe you some Naproxen for the pain and tessalon pearls for your cough.  If worsening symptoms please go to the ER.    It was nice meeting you!!  Your EKG was normal.  Your chest x ray showed some chronic changes in the lungs. Nothing worrisome today.  We gave you a shot of Toradol for pain in your chest.  You may want to follow up with [facility] and wellness to have further imaging done like a CT scan to get a better look at the chest.  I will prescribe you some Naproxen for the pain and tessalon pearls for  your cough.  If worsening symptoms please go to the ER.     ED Prescriptions    Medication Sig Dispense Auth. Provider   naproxen (NAPROSYN) 500 MG tablet Take 1 tablet (500 mg total) by mouth 2 (two) times daily. 30 tablet Gildardo Tickner A, NP   benzonatate (TESSALON) 100 MG capsule Take 1 capsule (100 mg total) by mouth every 8 (eight) hours. 21 capsule Dahlia Byes A, NP     Controlled Substance Prescriptions [place] Controlled Substance Registry consulted? Not Applicable   Janace Aris, NP 10/22/17 1754

## 2017-10-22 NOTE — Discharge Instructions (Signed)
It was nice meeting you!!  Your EKG was normal.  Your chest x ray showed some chronic changes in the lungs that could be from smoking. Nothing worrisome today.  [facility] to have further imaging done like a CT scan to get a better look at the chest.  I will prescribe you some Naproxen for the pain and tessalon pearls for your cough.  If worsening symptoms please go to the ER.   Fue un Dietitianplacer conocerte!! Tu EKG era normal. La radiografa de trax mostr algunos cambios crnicos en los pulmones que podran ser por fumar. Nada preocupante hoy. Es posible que desee hacer un seguimiento con Salud y Health visitorbienestar en la comunidad para obtener ms imgenes como una tomografa computarizada para obtener una mejor visin del trax. Te prescribir un poco de Naproxeno para el dolor y tesaln para tu tos. Si los sntomas empeoran, vaya a la sala de emergencias.

## 2019-06-19 ENCOUNTER — Encounter (HOSPITAL_COMMUNITY): Payer: Self-pay | Admitting: *Deleted

## 2019-06-19 ENCOUNTER — Other Ambulatory Visit: Payer: Self-pay

## 2019-06-19 ENCOUNTER — Emergency Department (HOSPITAL_COMMUNITY): Payer: Self-pay

## 2019-06-19 ENCOUNTER — Emergency Department (HOSPITAL_COMMUNITY)
Admission: EM | Admit: 2019-06-19 | Discharge: 2019-06-19 | Disposition: A | Discharge status: Home / Self Care | Payer: Self-pay | Attending: Emergency Medicine | Admitting: Emergency Medicine

## 2019-06-19 DIAGNOSIS — M542 Cervicalgia: Secondary | ICD-10-CM | POA: Insufficient documentation

## 2019-06-19 DIAGNOSIS — Y998 Other external cause status: Secondary | ICD-10-CM | POA: Insufficient documentation

## 2019-06-19 DIAGNOSIS — Y9389 Activity, other specified: Secondary | ICD-10-CM | POA: Insufficient documentation

## 2019-06-19 DIAGNOSIS — R221 Localized swelling, mass and lump, neck: Secondary | ICD-10-CM | POA: Insufficient documentation

## 2019-06-19 DIAGNOSIS — Y929 Unspecified place or not applicable: Secondary | ICD-10-CM | POA: Insufficient documentation

## 2019-06-19 DIAGNOSIS — Z87891 Personal history of nicotine dependence: Secondary | ICD-10-CM | POA: Insufficient documentation

## 2019-06-19 MED ORDER — IBUPROFEN 800 MG PO TABS: 800.0000 mg | ORAL_TABLET | Freq: Three times a day (TID) | ORAL | 0 refills | Status: AC

## 2019-06-19 MED ORDER — NAPROXEN 500 MG PO TABS: 500.0000 mg | ORAL_TABLET | Freq: Two times a day (BID) | ORAL | 0 refills | Status: AC
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
## 2019-06-19 NOTE — ED Notes (Signed)
Pt transported to CT via wheelchair, NAD noted
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
## 2019-06-19 NOTE — Discharge Instructions (Signed)
Take the prescribed medication as directed.  You may wish to use heating pad, warm compress, or warm soaks, shower to help with muscle soreness. Follow-up with your primary care doctor.  If you do not have one, you may wish to be seen at the health and wellness center.  You may call to set up an appointment. Return to the ED for new or worsening symptoms.

## 2019-06-19 NOTE — ED Notes (Signed)
Patient verbalizes understanding of discharge instructions. Opportunity for questioning and answers were provided. Armband removed by staff, pt discharged from ED.  
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
## 2020-07-22 ENCOUNTER — Encounter (HOSPITAL_COMMUNITY): Payer: Self-pay

## 2020-07-22 ENCOUNTER — Other Ambulatory Visit: Payer: Self-pay

## 2020-07-22 ENCOUNTER — Emergency Department (HOSPITAL_COMMUNITY)
Admission: EM | Admit: 2020-07-22 | Discharge: 2020-07-23 | Disposition: A | Discharge status: Home / Self Care | Payer: Self-pay | Attending: Emergency Medicine | Admitting: Emergency Medicine

## 2020-07-22 ENCOUNTER — Emergency Department (HOSPITAL_COMMUNITY): Payer: Self-pay

## 2020-07-22 DIAGNOSIS — K5732 Diverticulitis of large intestine without perforation or abscess without bleeding: Secondary | ICD-10-CM | POA: Insufficient documentation

## 2020-07-22 DIAGNOSIS — R1032 Left lower quadrant pain: Secondary | ICD-10-CM

## 2020-07-22 DIAGNOSIS — K5792 Diverticulitis of intestine, part unspecified, without perforation or abscess without bleeding: Secondary | ICD-10-CM

## 2020-07-22 DIAGNOSIS — Z20822 Contact with and (suspected) exposure to covid-19: Secondary | ICD-10-CM | POA: Insufficient documentation

## 2020-07-22 DIAGNOSIS — Z87891 Personal history of nicotine dependence: Secondary | ICD-10-CM | POA: Insufficient documentation

## 2020-07-22 LAB — CBC
HCT: 46.5 % (ref 39.0–52.0)
Hemoglobin: 15.7 g/dL (ref 13.0–17.0)
MCH: 30.7 pg (ref 26.0–34.0)
MCHC: 33.8 g/dL (ref 30.0–36.0)
MCV: 90.8 fL (ref 80.0–100.0)
Platelets: 316 10*3/uL (ref 150–400)
RBC: 5.12 MIL/uL (ref 4.22–5.81)
RDW: 12.2 % (ref 11.5–15.5)
WBC: 14.1 10*3/uL — ABNORMAL HIGH (ref 4.0–10.5)
nRBC: 0 % (ref 0.0–0.2)

## 2020-07-22 LAB — COMPREHENSIVE METABOLIC PANEL
ALT: 35 U/L (ref 0–44)
AST: 20 U/L (ref 15–41)
Albumin: 3.8 g/dL (ref 3.5–5.0)
Alkaline Phosphatase: 67 U/L (ref 38–126)
Anion gap: 9 (ref 5–15)
BUN: 13 mg/dL (ref 6–20)
CO2: 27 mmol/L (ref 22–32)
Calcium: 8.9 mg/dL (ref 8.9–10.3)
Chloride: 97 mmol/L — ABNORMAL LOW (ref 98–111)
Creatinine, Ser: 0.96 mg/dL (ref 0.61–1.24)
GFR, Estimated: >60 mL/min (ref >60)
Glucose, Bld: 111 mg/dL — ABNORMAL HIGH (ref 70–99)
Potassium: 3.5 mmol/L (ref 3.5–5.1)
Sodium: 133 mmol/L — ABNORMAL LOW (ref 135–145)
Total Bilirubin: 1.4 mg/dL — ABNORMAL HIGH (ref 0.3–1.2)
Total Protein: 7.2 g/dL (ref 6.5–8.1)

## 2020-07-22 LAB — URINALYSIS, ROUTINE W REFLEX MICROSCOPIC
Bilirubin Urine: NEGATIVE
Glucose, UA: NEGATIVE mg/dL
Hgb urine dipstick: NEGATIVE
Ketones, ur: NEGATIVE mg/dL
Leukocytes,Ua: NEGATIVE
Nitrite: NEGATIVE
Protein, ur: NEGATIVE mg/dL
Specific Gravity, Urine: 1.019 (ref 1.005–1.030)
pH: 6 (ref 5.0–8.0)

## 2020-07-22 LAB — LIPASE, BLOOD: Lipase: 34 U/L (ref 11–51)

## 2020-07-22 MED ORDER — ONDANSETRON HCL 4 MG/2ML IJ SOLN
4.0000 mg | Freq: Once | INTRAMUSCULAR | Status: AC
  Administered 2020-07-22: 4 mg via INTRAVENOUS
  Filled 2020-07-22: qty 2

## 2020-07-22 MED ORDER — MORPHINE SULFATE (PF) 4 MG/ML IV SOLN
4.0000 mg | Freq: Once | INTRAVENOUS | Status: AC
  Administered 2020-07-22: 4 mg via INTRAVENOUS
  Filled 2020-07-22: qty 1

## 2020-07-22 MED ORDER — SODIUM CHLORIDE 0.9 % IV BOLUS
500.0000 mL | Freq: Once | INTRAVENOUS | Status: AC
  Administered 2020-07-22: 500 mL via INTRAVENOUS

## 2020-07-22 NOTE — ED Provider Notes (Signed)
[facility]   CSN: [id number] Arrival date & time: 07/22/20  2018     History Chief Complaint  Patient presents with  . Flank Pain    Andre Wong is a 35 y.o. male presents to the Emergency Department complaining of gradual, persistent, progressively worsening left-sided abdominal and flank pain onset 3 days ago. Associated symptoms include constipation and decreased bowel movements.  Aggravating factors include moving and breathing, attempting to have a bowel movement.  Alleviating factors include nothing.  Patient reports he took some pain medicine but does not remember what kind.  He states this did not help.  Patient reports a history of bowel obstruction for which she has been admitted to the hospital in the past.  He has never had abdominal surgery.  Patient reports last bowel movement was yesterday and was very small and hard.  Additionally he reports that it required significant effort.  No blood.  Patient reports he is passing gas.  Denies nausea, vomiting, diarrhea, urinary symptoms, testicular pain, penile discharge.  Patient reports he has had subjective fevers at home.  No known sick contacts.  Not vaccinated for Covid.  No URI symptoms.  The history is provided by the patient and medical records. The history is limited by a language barrier. A language interpreter was used.       Past Medical History:  Diagnosis Date  . Diverticulitis 02/28/2016  . GERD (gastroesophageal reflux disease)     Patient Active Problem List   Diagnosis Date Noted  . Diverticulitis 02/28/2016  . Intractable abdominal pain 02/28/2016  . Leukocytosis 02/28/2016    Past Surgical History:  Procedure Laterality Date  . NO PAST SURGERIES         Family History  Problem Relation Age of Onset  . Diabetes Mother     Social History   Tobacco Use  . Smoking status: Former Smoker    Types: Cigarettes  . Smokeless tobacco:  Never Used  . Tobacco comment: 02/28/2016 "quit smoking 6-7 yr ago; smoked ~ 1 cigarette/month; smoked 1-2 years"  Substance Use Topics  . Alcohol use: Yes    Alcohol/week: 24.0 standard drinks    Types: 24 Cans of beer per week    Comment: 02/28/2016 "I don't drink q weekend; when I do it's ~ 24 beers"  . Drug use: No    Home Medications Prior to Admission medications   Medication Sig Start Date End Date Taking? Authorizing Provider  ciprofloxacin (CIPRO) 500 MG tablet Take 1 tablet (500 mg total) by mouth 2 (two) times daily for 10 days. 07/23/20 08/02/20 Yes Rutha Melgoza, Dahlia ClientHannah, PA-C  metroNIDAZOLE (FLAGYL) 500 MG tablet Take 1 tablet (500 mg total) by mouth 3 (three) times daily. 07/23/20  Yes Makaylin Carlo, Dahlia ClientHannah, PA-C  ondansetron (ZOFRAN) 4 MG tablet Take 1 tablet (4 mg total) by mouth every 8 (eight) hours as needed for nausea or vomiting. 07/23/20  Yes Helayna Dun, Dahlia ClientHannah, PA-C  oxyCODONE (ROXICODONE) 5 MG immediate release tablet Take 1 tablet (5 mg total) by mouth every 6 (six) hours as needed for severe pain. 07/23/20  Yes Calysta Craigo, Dahlia ClientHannah, PA-C  benzonatate (TESSALON) 100 MG capsule Take 1 capsule (100 mg total) by mouth every 8 (eight) hours. 10/22/17   Dahlia ByesBast, Traci A, NP  ibuprofen (ADVIL) 800 MG tablet Take 1 tablet (800 mg total) by mouth 3 (three) times daily. 06/19/19   Garlon HatchetSanders, Lisa M, PA-C  ibuprofen (ADVIL,MOTRIN) 600 MG tablet Take 1 tablet (600  mg total) by mouth every 8 (eight) hours as needed. Patient not taking: Reported on 03/06/2016 06/19/15   Benjiman Core, MD  naproxen (NAPROSYN) 500 MG tablet Take 1 tablet (500 mg total) by mouth 2 (two) times daily with a meal. 06/19/19   Garlon Hatchet, PA-C    Allergies    Patient has no known allergies.  Review of Systems   Review of Systems  Constitutional: Positive for fever. Negative for appetite change, diaphoresis, fatigue and unexpected weight change.  HENT: Negative for mouth sores.   Eyes: Negative for visual  disturbance.  Respiratory: Negative for cough, chest tightness, shortness of breath and wheezing.   Cardiovascular: Negative for chest pain.  Gastrointestinal: Positive for abdominal pain and constipation. Negative for diarrhea, nausea and vomiting.  Endocrine: Negative for polydipsia, polyphagia and polyuria.  Genitourinary: Positive for flank pain. Negative for dysuria, frequency, hematuria and urgency.  Musculoskeletal: Negative for back pain and neck stiffness.  Skin: Negative for rash.  Allergic/Immunologic: Negative for immunocompromised state.  Neurological: Negative for syncope, light-headedness and headaches.  Hematological: Does not bruise/bleed easily.  Psychiatric/Behavioral: Negative for sleep disturbance. The patient is not nervous/anxious.     Physical Exam Updated Vital Signs BP 112/72 (BP Location: Right Arm)   Pulse 86   Temp 99.8 F (37.7 C) (Oral)   Resp 18   Ht 5\' 5"  (1.651 m)   Wt 86.2 kg   SpO2 99%   BMI 31.62 kg/m   Physical Exam Vitals and nursing note reviewed.  Constitutional:      General: He is not in acute distress.    Appearance: He is not diaphoretic.  HENT:     Head: Normocephalic.  Eyes:     General: No scleral icterus.    Conjunctiva/sclera: Conjunctivae normal.  Cardiovascular:     Rate and Rhythm: Normal rate and regular rhythm.     Pulses: Normal pulses.          Radial pulses are 2+ on the right side and 2+ on the left side.  Pulmonary:     Effort: No tachypnea, accessory muscle usage, prolonged expiration, respiratory distress or retractions.     Breath sounds: No stridor.     Comments: Equal chest rise. No increased work of breathing. Abdominal:     General: Bowel sounds are decreased. There is no distension.     Palpations: Abdomen is soft.     Tenderness: There is abdominal tenderness in the periumbilical area, left upper quadrant and left lower quadrant. There is guarding. There is no right CVA tenderness, left CVA  tenderness or rebound.  Musculoskeletal:     Cervical back: Normal range of motion.     Comments: Moves all extremities equally and without difficulty.  Skin:    General: Skin is warm and dry.     Capillary Refill: Capillary refill takes less than 2 seconds.  Neurological:     Mental Status: He is alert.     GCS: GCS eye subscore is 4. GCS verbal subscore is 5. GCS motor subscore is 6.     Comments: Speech is clear and goal oriented.  Psychiatric:        Mood and Affect: Mood normal.     ED Results / Procedures / Treatments   Labs (all labs ordered are listed, but only abnormal results are displayed) Labs Reviewed  COMPREHENSIVE METABOLIC PANEL - Abnormal; Notable for the following components:      Result Value   Sodium 133 (*)  Chloride 97 (*)    Glucose, Bld 111 (*)    Total Bilirubin 1.4 (*)    All other components within normal limits  CBC - Abnormal; Notable for the following components:   WBC 14.1 (*)    All other components within normal limits  SARS CORONAVIRUS 2 (TAT 6-24 HRS)  CULTURE, BLOOD (SINGLE)  URINE CULTURE  LIPASE, BLOOD  URINALYSIS, ROUTINE W REFLEX MICROSCOPIC  LACTIC ACID, PLASMA  PROTIME-INR  APTT  LACTIC ACID, PLASMA    EKG EKG Interpretation  Date/Time:  Sunday July 23 2020 00:39:28 EDT Ventricular Rate:  85 PR Interval:  158 QRS Duration: 92 QT Interval:  352 QTC Calculation: 418 R Axis:   51 Text Interpretation: Normal sinus rhythm Normal ECG No acute changes Confirmed by Drema Pry [phone]) on 07/23/2020 4:57:25 AM   Radiology DG Chest 2 View  Result Date: 07/22/2020 CLINICAL DATA:  Fever EXAM: CHEST - 2 VIEW COMPARISON:  10/22/2017 FINDINGS: Low lung volumes. No focal consolidation or effusion. Normal cardiomediastinal silhouette. No pneumothorax. IMPRESSION: No active cardiopulmonary disease. Electronically Signed   By: Jasmine Pang M.D.   On: 07/22/2020 22:24   CT ABDOMEN PELVIS W CONTRAST  Result Date:  07/23/2020 CLINICAL DATA:  Left lower quadrant pain. EXAM: CT ABDOMEN AND PELVIS WITH CONTRAST TECHNIQUE: Multidetector CT imaging of the abdomen and pelvis was performed using the standard protocol following bolus administration of intravenous contrast. CONTRAST:  OMNIPAQUE IOHEXOL 300 MG/ML  SOLN COMPARISON:  February 28, 2016 FINDINGS: Lower chest: Mild atelectasis is seen within the posterior aspects of the bilateral lung bases. Hepatobiliary: There is diffuse fatty infiltration of the liver parenchyma. No focal liver abnormality is seen. No gallstones, gallbladder wall thickening, or biliary dilatation. Pancreas: Unremarkable. No pancreatic ductal dilatation or surrounding inflammatory changes. Spleen: Normal in size without focal abnormality. Adrenals/Urinary Tract: Adrenal glands are unremarkable. Kidneys are normal, without renal calculi, focal lesion, or hydronephrosis. Bladder is unremarkable. Stomach/Bowel: Stomach is within normal limits. Appendix appears normal. No evidence of bowel dilatation. Moderate to markedly inflamed diverticula are seen within the proximal descending colon with mildly inflamed diverticula also noted within the mid sigmoid colon. There is no evidence of associated perforation or abscess. Vascular/Lymphatic: No significant vascular findings are present. No enlarged abdominal or pelvic lymph nodes. Reproductive: Prostate is unremarkable. Other: No abdominal wall hernia or abnormality. No abdominopelvic ascites. Musculoskeletal: No acute or significant osseous findings. IMPRESSION: 1. Moderate to marked severity diverticulitis involving the proximal descending colon with mild diverticulitis seen within the mid sigmoid colon. 2. Hepatic steatosis. Electronically Signed   By: Aram Candela M.D.   On: 07/23/2020 04:01    Procedures Procedures   Medications Ordered in ED Medications  ciprofloxacin (CIPRO) IVPB 400 mg (has no administration in time range)  sodium  chloride 0.9 % bolus 500 mL (0 mLs Intravenous Stopped 07/23/20 0220)  morphine 4 MG/ML injection 4 mg (4 mg Intravenous Given 07/22/20 2314)  ondansetron (ZOFRAN) injection 4 mg (4 mg Intravenous Given 07/22/20 2315)  iohexol (OMNIPAQUE) 300 MG/ML solution 100 mL (100 mLs Intravenous Contrast Given 07/23/20 0341)  morphine 4 MG/ML injection 4 mg (4 mg Intravenous Given 07/23/20 0551)  metroNIDAZOLE (FLAGYL) IVPB 500 mg (500 mg Intravenous New Bag/Given 07/23/20 0556)    ED Course  I have reviewed the triage vital signs and the nursing notes.  Pertinent labs & imaging results that were available during my care of the patient were reviewed by me and considered in my medical decision  making (see chart for details).  Clinical Course as of 07/23/20 0657  Sat Jul 22, 2020  2345 Temp(!): 100.9 F (38.3 C) Febrile on arrival [HM]  2345 WBC(!): 14.1 Leukocytosis noted [HM]  2345 Sodium(!): 133 Mild hypochloremia.  Fluids given. [HM]  2345 Leukocytes,Ua: NEGATIVE No evidence of urinary tract infection [HM]  2346 Total Bilirubin(!): 1.4 Elevated total bilirubin -left lower quadrant pain.  No right upper quadrant abdominal pain.  Normal AST and ALT. [HM]    Clinical Course User Index [HM] Yaakov Saindon, Boyd Kerbs   MDM Rules/Calculators/A&P                           Presents with left-sided abdominal and flank pain.  Febrile on arrival.  Significant tenderness to palpation and guarding on exam.  Concern for recurrent bowel obstruction versus pyelonephritis versus renal colic.  Less likely to be Pilo or nephrolithiasis given normal urinalysis.  CT scan pending along with additional sepsis work-up.  5:09 AM T scan shows diverticulitis.  Patient continues to have tenderness on abdominal exam.  Labs are reassuring with mild leukocytosis.  Fever has resolved here in the emergency department.  Patient is without vomiting.  Will give IV antibiotics and pain control.  Will p.o. trial.  Patient otherwise  well-appearing.  6:54 AM Patient continues to be well-appearing.  Tolerating p.o. without difficulty.  No return of fever.  Patient given metronidazole and ciprofloxacin here in the emergency department.  Pain well controlled.  Will be discharged home with same.  Patient will follow up with primary care for further evaluation and management.  Discussed reasons to return to the emergency department.  Patient states understanding and is in agreement the plan.   Final Clinical Impression(s) / ED Diagnoses Final diagnoses:  Diverticulitis  Left lower quadrant abdominal pain    Rx / DC Orders ED Discharge Orders         Ordered    ciprofloxacin (CIPRO) 500 MG tablet  2 times daily        07/23/20 0654    metroNIDAZOLE (FLAGYL) 500 MG tablet  3 times daily        07/23/20 0654    ondansetron (ZOFRAN) 4 MG tablet  Every 8 hours PRN        07/23/20 0654    oxyCODONE (ROXICODONE) 5 MG immediate release tablet  Every 6 hours PRN        07/23/20 0657           Ayeshia Coppin, Dahlia Client, PA-C 07/23/20 0657    Cardama, Amadeo Garnet, MD 07/23/20 [id number]

## 2020-07-22 NOTE — ED Triage Notes (Signed)
Patient arrives from home with L sided flank pain x 3 days with diarrhea, denies any urinary symptoms, also states he feels like he has a fever

## 2020-07-23 ENCOUNTER — Emergency Department (HOSPITAL_COMMUNITY): Payer: Self-pay

## 2020-07-23 LAB — PROTIME-INR
INR: 1 (ref 0.8–1.2)
Prothrombin Time: 13.2 seconds (ref 11.4–15.2)

## 2020-07-23 LAB — LACTIC ACID, PLASMA: Lactic Acid, Venous: 1.6 mmol/L (ref 0.5–1.9)

## 2020-07-23 LAB — SARS CORONAVIRUS 2 (TAT 6-24 HRS): SARS Coronavirus 2: NEGATIVE

## 2020-07-23 LAB — APTT: aPTT: 34 seconds (ref 24–36)

## 2020-07-23 MED ORDER — MORPHINE SULFATE (PF) 4 MG/ML IV SOLN
4.0000 mg | Freq: Once | INTRAVENOUS | Status: AC
  Administered 2020-07-23: 4 mg via INTRAVENOUS
  Filled 2020-07-23: qty 1

## 2020-07-23 MED ORDER — CIPROFLOXACIN IN D5W 400 MG/200ML IV SOLN
400.0000 mg | Freq: Once | INTRAVENOUS | Status: AC
  Administered 2020-07-23: 400 mg via INTRAVENOUS
  Filled 2020-07-23: qty 200

## 2020-07-23 MED ORDER — CIPROFLOXACIN HCL 500 MG PO TABS: 500.0000 mg | ORAL_TABLET | Freq: Two times a day (BID) | ORAL | 0 refills | Status: AC

## 2020-07-23 MED ORDER — IOHEXOL 300 MG/ML  SOLN
100.0000 mL | Freq: Once | INTRAMUSCULAR | Status: AC | PRN
  Administered 2020-07-23: 100 mL via INTRAVENOUS

## 2020-07-23 MED ORDER — METRONIDAZOLE IN NACL 5-0.79 MG/ML-% IV SOLN
500.0000 mg | Freq: Once | INTRAVENOUS | Status: AC
  Administered 2020-07-23: 500 mg via INTRAVENOUS
  Filled 2020-07-23: qty 100

## 2020-07-23 MED ORDER — OXYCODONE HCL 5 MG PO TABS: 5.0000 mg | ORAL_TABLET | Freq: Four times a day (QID) | ORAL | 0 refills | Status: AC | PRN

## 2020-07-23 MED ORDER — METRONIDAZOLE 500 MG PO TABS: 500.0000 mg | ORAL_TABLET | Freq: Three times a day (TID) | ORAL | 0 refills | Status: AC

## 2020-07-23 MED ORDER — ONDANSETRON HCL 4 MG PO TABS: 4.0000 mg | ORAL_TABLET | Freq: Three times a day (TID) | ORAL | 0 refills | Status: AC | PRN

## 2020-07-23 NOTE — ED Notes (Signed)
Patient transported to CT 

## 2020-07-23 NOTE — ED Notes (Signed)
PA Muthersbaugh notified rn that pt does not need a second lactic since first one was within normal range

## 2020-07-24 LAB — URINE CULTURE: Culture: NO GROWTH

## 2020-07-28 LAB — CULTURE, BLOOD (SINGLE): Culture: NO GROWTH

## 2021-12-07 IMAGING — DX DG CHEST 2V
2 series · 2 of 2 positions shown · non-contrast
Comparison: 10/22/2017

CLINICAL DATA: Fever

EXAM:
CHEST - 2 VIEW

[chest pa]
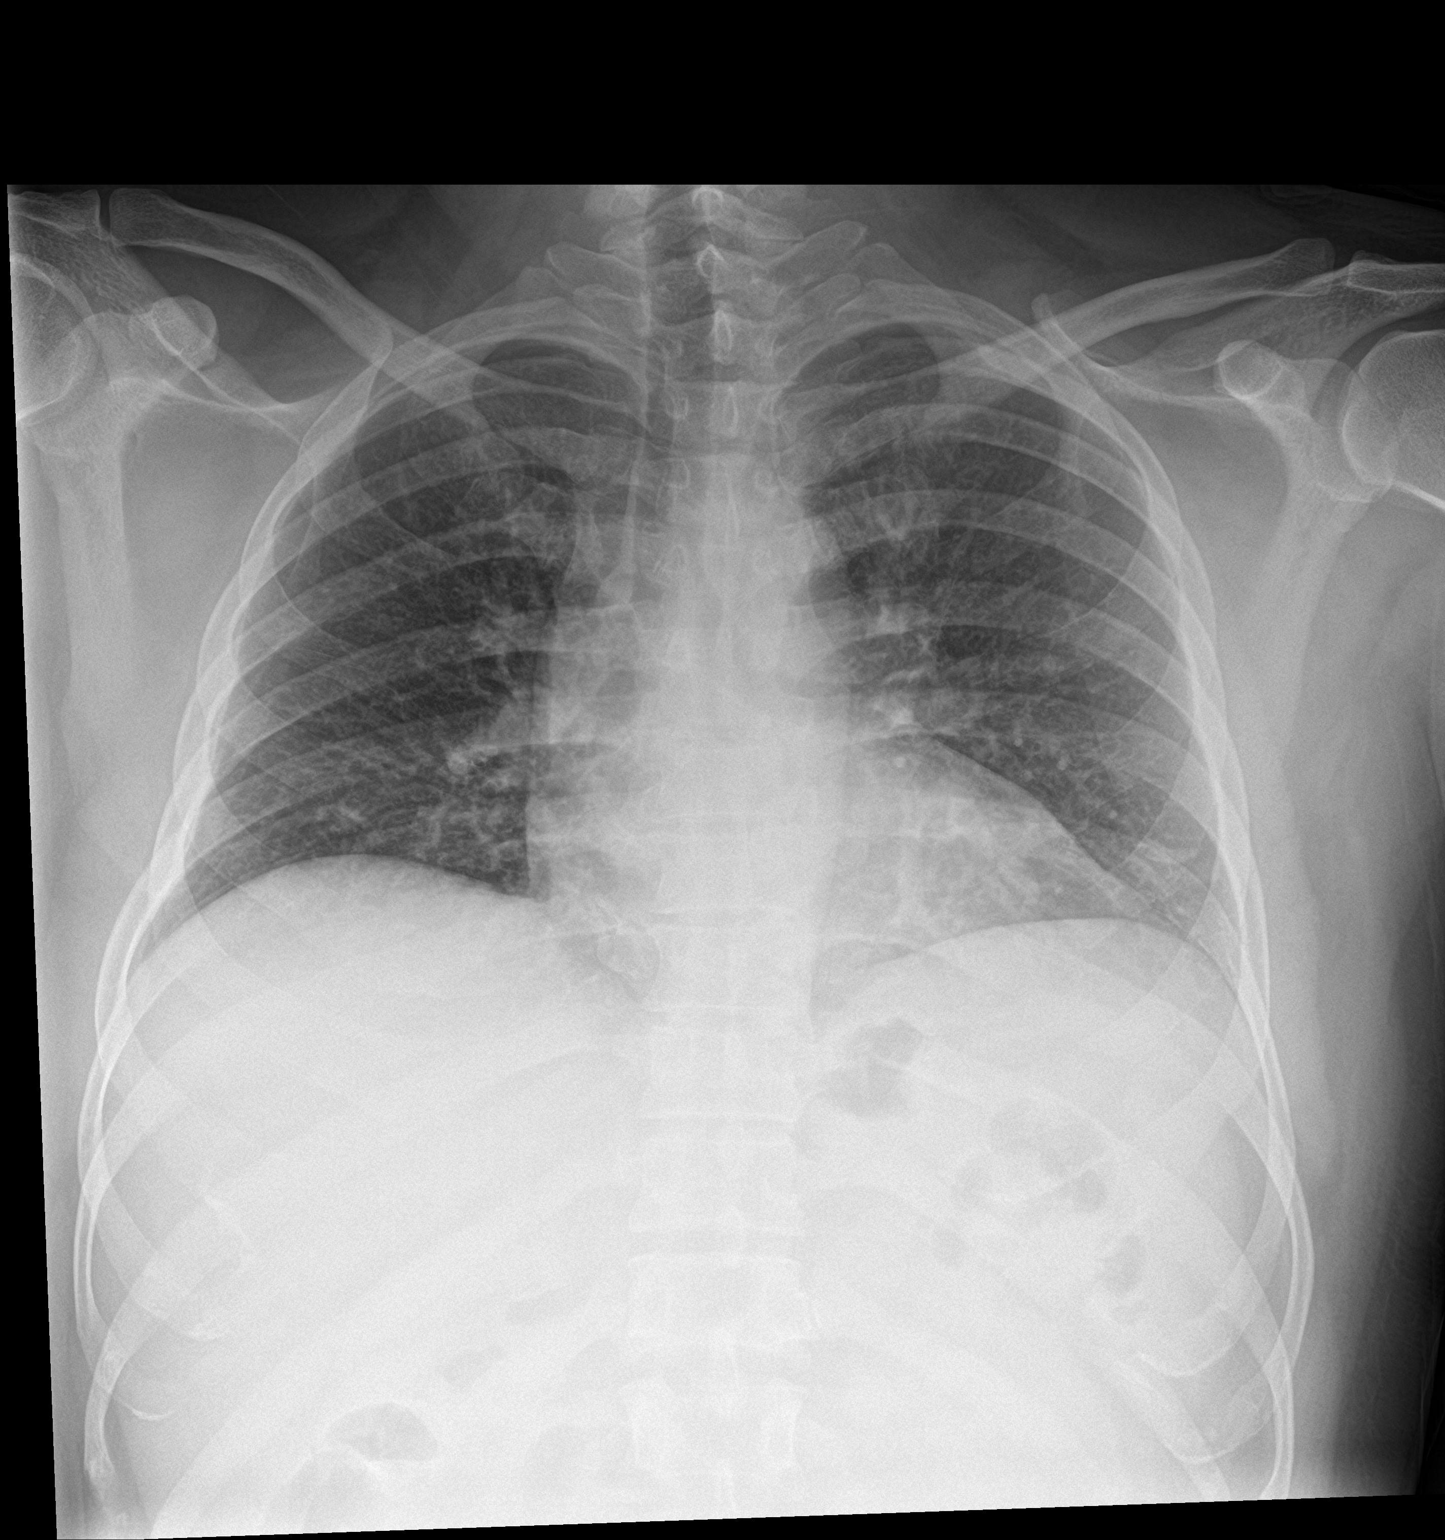

[chest lat]
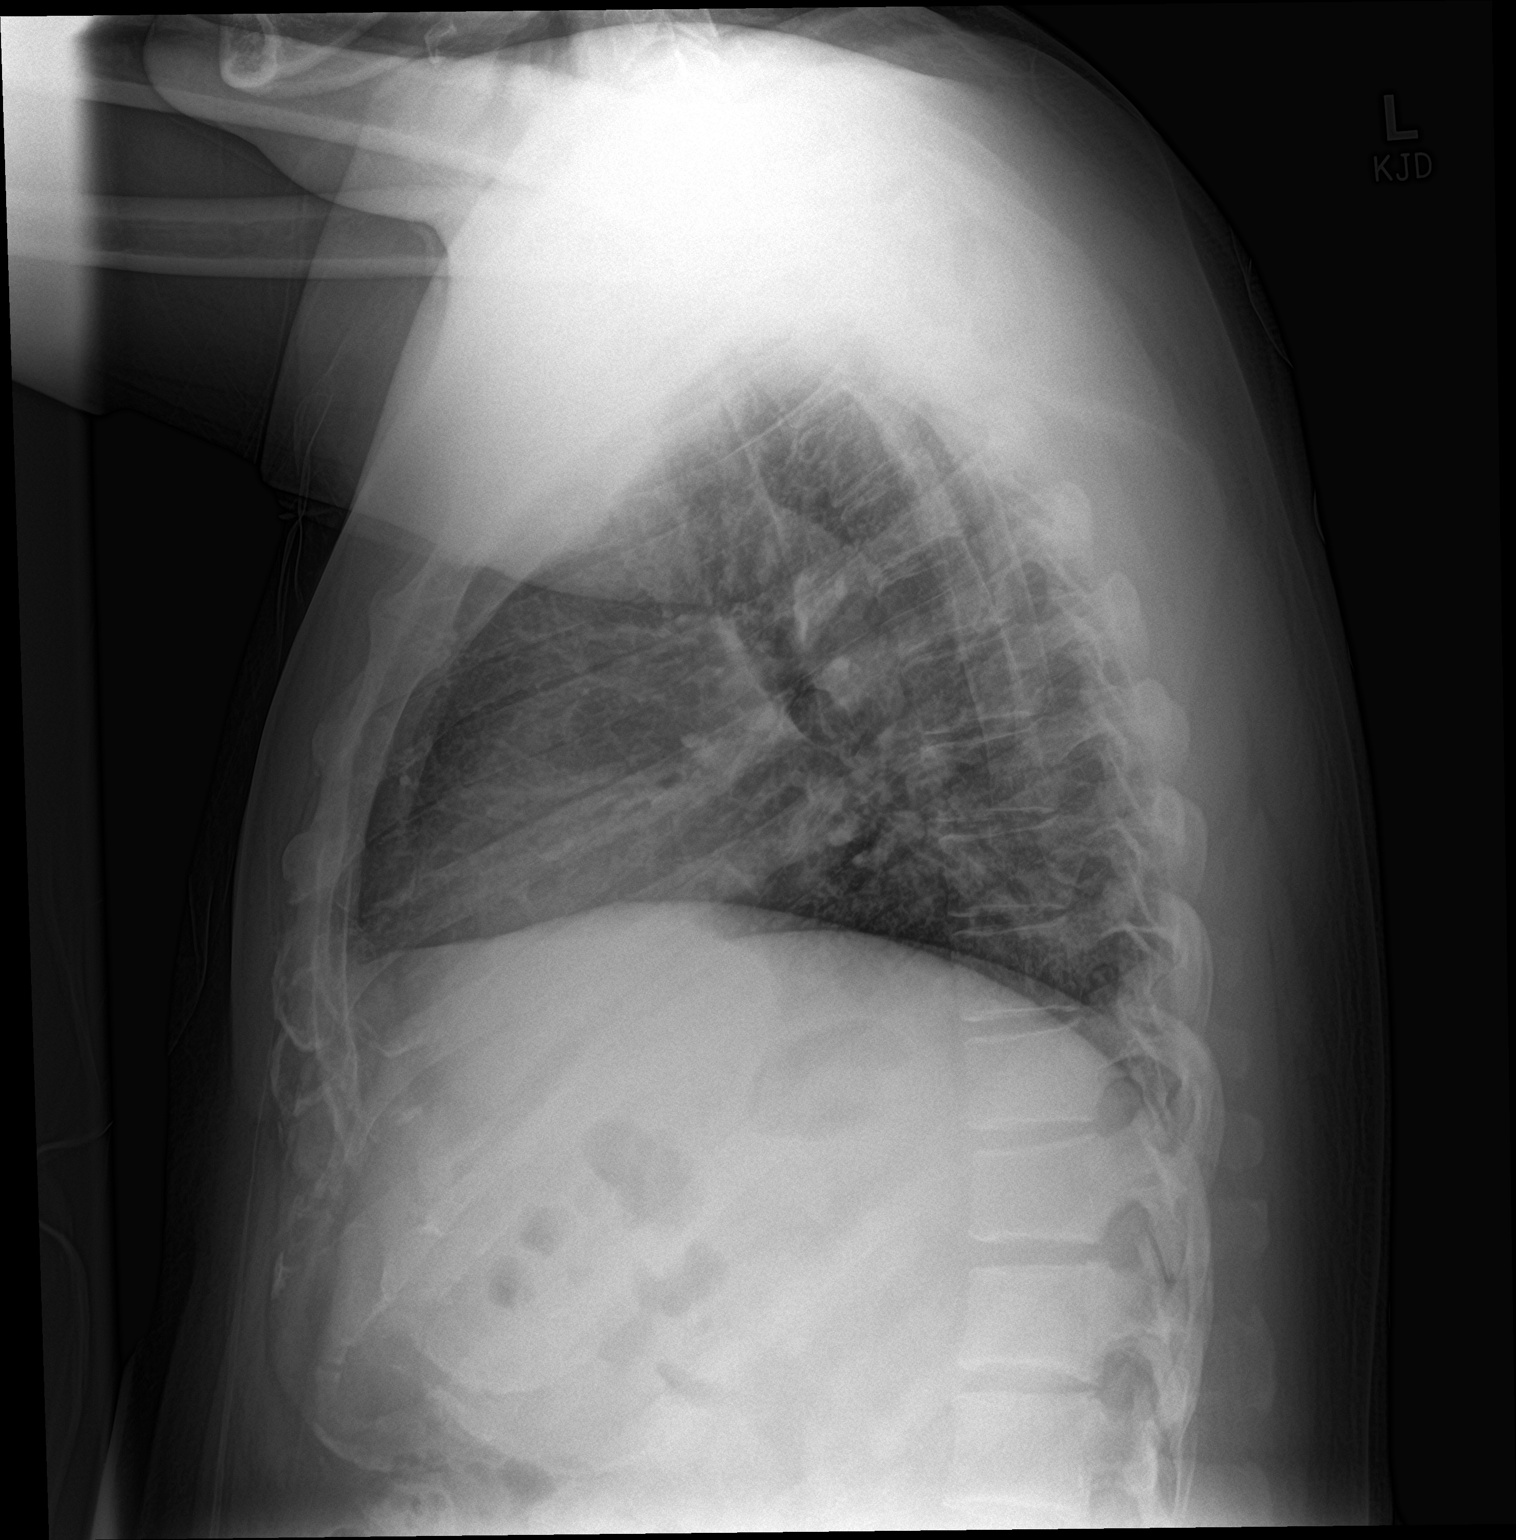

[2 of 2 positions shown; findings below may reference images not displayed]

FINDINGS: Low lung volumes. No focal consolidation or effusion. Normal
cardiomediastinal silhouette. No pneumothorax.
IMPRESSION: No active cardiopulmonary disease.
